# Patient Record
Sex: Female | Born: 1965 | Hispanic: No | Marital: Married | Smoking: Never smoker
Health system: Southern US, Community
[De-identification: ages and names within clinical notes are randomized; demographics above are authoritative.]

## PROBLEM LIST (undated history)

## (undated) DIAGNOSIS — K219 Gastro-esophageal reflux disease without esophagitis: Secondary | ICD-10-CM

## (undated) DIAGNOSIS — E079 Disorder of thyroid, unspecified: Secondary | ICD-10-CM

## (undated) HISTORY — DX: Morbid (severe) obesity due to excess calories: E66.01

---

## 2002-01-01 ENCOUNTER — Encounter: Payer: Self-pay | Admitting: Emergency Medicine

## 2002-01-01 ENCOUNTER — Emergency Department (HOSPITAL_COMMUNITY): Admission: EM | Admit: 2002-01-01 | Discharge: 2002-01-01 | Payer: Self-pay | Admitting: *Deleted

## 2002-01-06 ENCOUNTER — Encounter: Payer: Self-pay | Admitting: Surgery

## 2002-01-06 ENCOUNTER — Encounter: Admission: RE | Admit: 2002-01-06 | Discharge: 2002-01-06 | Payer: Self-pay | Admitting: Surgery

## 2002-02-05 ENCOUNTER — Encounter: Admission: RE | Admit: 2002-02-05 | Discharge: 2002-02-05 | Payer: Self-pay | Admitting: Radiology

## 2002-04-07 ENCOUNTER — Encounter: Payer: Self-pay | Admitting: Surgery

## 2002-04-07 ENCOUNTER — Encounter: Admission: RE | Admit: 2002-04-07 | Discharge: 2002-04-07 | Payer: Self-pay | Admitting: Radiology

## 2002-04-30 ENCOUNTER — Ambulatory Visit (HOSPITAL_BASED_OUTPATIENT_CLINIC_OR_DEPARTMENT_OTHER): Admission: RE | Admit: 2002-04-30 | Discharge: 2002-04-30 | Payer: Self-pay | Admitting: Surgery

## 2012-04-21 DIAGNOSIS — H5460 Unqualified visual loss, one eye, unspecified: Secondary | ICD-10-CM | POA: Insufficient documentation

## 2012-04-24 DIAGNOSIS — R7611 Nonspecific reaction to tuberculin skin test without active tuberculosis: Secondary | ICD-10-CM | POA: Insufficient documentation

## 2012-07-10 DIAGNOSIS — H4710 Unspecified papilledema: Secondary | ICD-10-CM | POA: Insufficient documentation

## 2013-02-21 DIAGNOSIS — E7800 Pure hypercholesterolemia, unspecified: Secondary | ICD-10-CM | POA: Insufficient documentation

## 2013-07-06 DIAGNOSIS — Q799 Congenital malformation of musculoskeletal system, unspecified: Secondary | ICD-10-CM | POA: Insufficient documentation

## 2013-07-06 DIAGNOSIS — H524 Presbyopia: Secondary | ICD-10-CM | POA: Insufficient documentation

## 2014-04-12 DIAGNOSIS — J309 Allergic rhinitis, unspecified: Secondary | ICD-10-CM | POA: Insufficient documentation

## 2014-04-12 DIAGNOSIS — E538 Deficiency of other specified B group vitamins: Secondary | ICD-10-CM | POA: Insufficient documentation

## 2015-01-21 DIAGNOSIS — M19031 Primary osteoarthritis, right wrist: Secondary | ICD-10-CM | POA: Insufficient documentation

## 2015-03-14 ENCOUNTER — Encounter (HOSPITAL_BASED_OUTPATIENT_CLINIC_OR_DEPARTMENT_OTHER): Payer: Self-pay | Admitting: *Deleted

## 2015-03-14 ENCOUNTER — Emergency Department (HOSPITAL_BASED_OUTPATIENT_CLINIC_OR_DEPARTMENT_OTHER)
Admission: EM | Admit: 2015-03-14 | Discharge: 2015-03-14 | Disposition: A | Discharge status: Home / Self Care | Payer: Self-pay | Attending: Emergency Medicine | Admitting: Emergency Medicine

## 2015-03-14 ENCOUNTER — Emergency Department (HOSPITAL_BASED_OUTPATIENT_CLINIC_OR_DEPARTMENT_OTHER): Payer: Self-pay

## 2015-03-14 DIAGNOSIS — K219 Gastro-esophageal reflux disease without esophagitis: Secondary | ICD-10-CM | POA: Insufficient documentation

## 2015-03-14 DIAGNOSIS — Z79899 Other long term (current) drug therapy: Secondary | ICD-10-CM | POA: Insufficient documentation

## 2015-03-14 DIAGNOSIS — R10816 Epigastric abdominal tenderness: Secondary | ICD-10-CM

## 2015-03-14 DIAGNOSIS — R630 Anorexia: Secondary | ICD-10-CM | POA: Insufficient documentation

## 2015-03-14 DIAGNOSIS — Z3202 Encounter for pregnancy test, result negative: Secondary | ICD-10-CM | POA: Insufficient documentation

## 2015-03-14 DIAGNOSIS — Z7951 Long term (current) use of inhaled steroids: Secondary | ICD-10-CM | POA: Insufficient documentation

## 2015-03-14 LAB — CBC WITH DIFFERENTIAL/PLATELET
Basophils Absolute: 0 10*3/uL (ref 0.0–0.1)
Basophils Relative: 0 % (ref 0–1)
Eosinophils Absolute: 0.1 10*3/uL (ref 0.0–0.7)
Eosinophils Relative: 2 % (ref 0–5)
HCT: 35.5 % — ABNORMAL LOW (ref 36.0–46.0)
HEMOGLOBIN: 11.6 g/dL — AB (ref 12.0–15.0)
LYMPHS ABS: 1.7 10*3/uL (ref 0.7–4.0)
Lymphocytes Relative: 22 % (ref 12–46)
MCH: 28.5 pg (ref 26.0–34.0)
MCHC: 32.7 g/dL (ref 30.0–36.0)
MCV: 87.2 fL (ref 78.0–100.0)
MONO ABS: 0.6 10*3/uL (ref 0.1–1.0)
MONOS PCT: 8 % (ref 3–12)
NEUTROS ABS: 5 10*3/uL (ref 1.7–7.7)
Neutrophils Relative %: 68 % (ref 43–77)
Platelets: 307 10*3/uL (ref 150–400)
RBC: 4.07 MIL/uL (ref 3.87–5.11)
RDW: 12.9 % (ref 11.5–15.5)
WBC: 7.4 10*3/uL (ref 4.0–10.5)

## 2015-03-14 LAB — URINE MICROSCOPIC-ADD ON

## 2015-03-14 LAB — COMPREHENSIVE METABOLIC PANEL
ALBUMIN: 3.6 g/dL (ref 3.5–5.0)
ALT: 26 U/L (ref 14–54)
AST: 23 U/L (ref 15–41)
Alkaline Phosphatase: 72 U/L (ref 38–126)
Anion gap: 8 (ref 5–15)
BUN: 12 mg/dL (ref 6–20)
CALCIUM: 9.1 mg/dL (ref 8.9–10.3)
CHLORIDE: 102 mmol/L (ref 101–111)
CO2: 28 mmol/L (ref 22–32)
Creatinine, Ser: 0.58 mg/dL (ref 0.44–1.00)
GFR calc Af Amer: >60 mL/min (ref >60)
GFR calc non Af Amer: >60 mL/min (ref >60)
Glucose, Bld: 108 mg/dL — ABNORMAL HIGH (ref 65–99)
Potassium: 3.7 mmol/L (ref 3.5–5.1)
SODIUM: 138 mmol/L (ref 135–145)
Total Bilirubin: 0.6 mg/dL (ref 0.3–1.2)
Total Protein: 7.5 g/dL (ref 6.5–8.1)

## 2015-03-14 LAB — URINALYSIS, ROUTINE W REFLEX MICROSCOPIC
BILIRUBIN URINE: NEGATIVE
Glucose, UA: NEGATIVE mg/dL
KETONES UR: NEGATIVE mg/dL
Leukocytes, UA: NEGATIVE
Nitrite: NEGATIVE
Protein, ur: NEGATIVE mg/dL
SPECIFIC GRAVITY, URINE: 1.008 (ref 1.005–1.030)
UROBILINOGEN UA: 0.2 mg/dL (ref 0.0–1.0)
pH: 5.5 (ref 5.0–8.0)

## 2015-03-14 LAB — LIPASE, BLOOD: LIPASE: 26 U/L (ref 22–51)

## 2015-03-14 LAB — PREGNANCY, URINE: PREG TEST UR: NEGATIVE

## 2015-03-14 MED ORDER — OMEPRAZOLE 20 MG PO CPDR: 20.0000 mg | DELAYED_RELEASE_CAPSULE | Freq: Every day | ORAL | Status: DC

## 2015-03-14 MED ORDER — GI COCKTAIL ~~LOC~~
30.0000 mL | Freq: Once | ORAL | Status: AC
  Administered 2015-03-14: 30 mL via ORAL
  Filled 2015-03-14: qty 30

## 2015-03-14 MED ORDER — ONDANSETRON HCL 4 MG/2ML IJ SOLN
4.0000 mg | Freq: Once | INTRAMUSCULAR | Status: AC
  Administered 2015-03-14: 4 mg via INTRAVENOUS
  Filled 2015-03-14: qty 2

## 2015-03-14 MED ORDER — PANTOPRAZOLE SODIUM 40 MG IV SOLR
40.0000 mg | Freq: Once | INTRAVENOUS | Status: AC
  Administered 2015-03-14: 40 mg via INTRAVENOUS
  Filled 2015-03-14: qty 40

## 2015-03-14 MED ORDER — MORPHINE SULFATE 4 MG/ML IJ SOLN
4.0000 mg | Freq: Once | INTRAMUSCULAR | Status: AC
  Administered 2015-03-14: 4 mg via INTRAVENOUS
  Filled 2015-03-14: qty 1

## 2015-03-14 NOTE — ED Provider Notes (Signed)
Patient seen/examined in the Emergency Department in conjunction with Midlevel Provider  Patient reports epigastric pain Exam : awake/alert, moderate epigastric tenderness Plan: advised [place] testing    Zadie Rhine, MD 03/14/15 [phone]

## 2015-03-14 NOTE — ED Provider Notes (Signed)
CSN: [id number]     Arrival date & time 03/14/15  1332 History   First MD Initiated Contact with Patient 03/14/15 1420     Chief Complaint  Patient presents with  . Abdominal Pain   Jessica Fleming is a 49 y.o. female who is otherwise healthy presents to the ED complaining of epigastric abdominal pain for the past week. She reports she has had a gastric ulcer when she was in high school and this pain feels similar. She complains of 9 out of 10 epigastric abdominal pain that is worse immediately after eating. She complains of a burning sensation after she has eaten. She reports sometimes her pain will move up into her chest, but she denies current chest pain. She is taking nothing for treatment today. Her last menstrual cycle was 03/07/2015 and she is currently on her cycle. She denies lower abdominal pain. The patient denies fevers, chills, vomiting, nausea, hematemesis, diarrhea, constipation, hematochezia, urinary symptoms, lower abdominal pain, chest pain, shortness of breath, palpitations, cough, lightheadedness, dizziness, rashes or weakness.  (Consider location/radiation/quality/duration/timing/severity/associated sxs/prior Treatment) HPI  History reviewed. No pertinent past medical history. History reviewed. No pertinent past surgical history. History reviewed. No pertinent family history. History  Substance Use Topics  . Smoking status: Never Smoker   . Smokeless tobacco: Not on file  . Alcohol Use: No   OB History    No data available     Review of Systems  Constitutional: Positive for appetite change. Negative for fever and chills.  HENT: Negative for congestion and sore throat.   Eyes: Negative for visual disturbance.  Respiratory: Negative for cough, shortness of breath and wheezing.   Cardiovascular: Negative for chest pain and palpitations.  Gastrointestinal: Positive for abdominal pain. Negative for nausea, vomiting, diarrhea and blood in stool.  Genitourinary: Negative  for dysuria, urgency, frequency, hematuria and difficulty urinating.  Musculoskeletal: Negative for back pain and neck pain.  Skin: Negative for rash.  Neurological: Negative for dizziness, weakness, light-headedness and headaches.      Allergies  Review of patient's allergies indicates no known allergies.  Home Medications   Prior to Admission medications   Medication Sig Start Date End Date Taking? Authorizing Provider  cetirizine (ZYRTEC) 10 MG tablet Take 10 mg by mouth daily.   Yes Historical Provider, MD  fluticasone (FLONASE) 50 MCG/ACT nasal spray Place 2 sprays into both nostrils daily.   Yes Historical Provider, MD  omeprazole (PRILOSEC) 20 MG capsule Take 1 capsule (20 mg total) by mouth daily. 03/14/15   Everlene Farrier, PA-C   BP 109/53 mmHg  Pulse 58  Temp(Src) 98.3 F (36.8 C) (Oral)  Resp 16  Ht 5\' 2"  (1.575 m)  Wt 190 lb (86.183 kg)  BMI 34.74 kg/m2  SpO2 98%  LMP 03/14/2015 (LMP Unknown) Physical Exam  Constitutional: She is oriented to person, place, and time. She appears well-developed and well-nourished. No distress.  Nontoxic appearing.  HENT:  Head: Normocephalic and atraumatic.  Mouth/Throat: Oropharynx is clear and moist. No oropharyngeal exudate.  Eyes: Conjunctivae are normal. Pupils are equal, round, and reactive to light. Right eye exhibits no discharge. Left eye exhibits no discharge.  Neck: Neck supple. No JVD present.  Cardiovascular: Normal rate, regular rhythm, normal heart sounds and intact distal pulses.  Exam reveals no gallop and no friction rub.   No murmur heard. Bilateral radial pulses are intact.  Pulmonary/Chest: Effort normal and breath sounds normal. No respiratory distress. She has no wheezes. She has no rales.  Lungs  are clear to auscultation bilaterally.  Abdominal: Soft. Bowel sounds are normal. She exhibits no distension and no mass. There is tenderness. There is no rebound and no guarding.  Abdomen is soft. Bowel sounds are  present. Patient has epigastric tenderness to palpation. No right upper quadrant tenderness. Negative Murphy sign. No McBurney's point tenderness. Negative psoas and obturator sign.  Musculoskeletal: She exhibits no edema or tenderness.  No lower extremity edema or tenderness.  Lymphadenopathy:    She has no cervical adenopathy.  Neurological: She is alert and oriented to person, place, and time. Coordination normal.  Skin: Skin is warm and dry. No rash noted. She is not diaphoretic. No erythema. No pallor.  Psychiatric: She has a normal mood and affect. Her behavior is normal.  Nursing note and vitals reviewed.   ED Course  Procedures (including critical care time) Labs Review Labs Reviewed  COMPREHENSIVE METABOLIC PANEL - Abnormal; Notable for the following:    Glucose, Bld 108 (*)    All other components within normal limits  CBC WITH DIFFERENTIAL/PLATELET - Abnormal; Notable for the following:    Hemoglobin 11.6 (*)    HCT 35.5 (*)    All other components within normal limits  PREGNANCY, URINE  LIPASE, BLOOD  URINALYSIS, ROUTINE W REFLEX MICROSCOPIC (NOT AT [facility])    Imaging Review US Abdomen Complete  03/14/2015   CLINICAL DATA:  Worsening epigastric pain over the past week. Left upper quadrant and epigastric tenderness. Initial encounter.  EXAM: ULTRASOUND ABDOMEN COMPLETE  COMPARISON:  None.  FINDINGS: Gallbladder: No gallstones or wall thickening visualized. No sonographic Murphy sign noted.  Common bile duct: Diameter: 0.3 cm.  Liver: No focal lesion identified. Increased in parenchymal echogenicity.  IVC: No abnormality visualized.  Pancreas: Visualized portion unremarkable.  Spleen: Size and appearance within normal limits.  Right Kidney: Length: 10.0 cm. Echogenicity within normal limits. No mass or hydronephrosis visualized.  Left Kidney: Length: 10.5 cm. Echogenicity within normal limits. No mass or hydronephrosis visualized.  Abdominal aorta: No aneurysm visualized.  Other  findings: None.  IMPRESSION: No acute finding.  Negative for gallstones.  Fatty infiltration of the liver.   Electronically Signed   By: Drusilla Kanner M.D.   On: 03/14/2015 16:41     EKG Interpretation   Date/Time:  Monday March 14 2015 15:19:35 EDT Ventricular Rate:  71 PR Interval:  154 QRS Duration: 84 QT Interval:  412 QTC Calculation: 447 R Axis:   20 Text Interpretation:  Normal sinus rhythm Low voltage QRS Borderline ECG  No significant change since last tracing Confirmed by Bebe Shaggy  MD, DONALD  [phone]) on 03/14/2015 3:21:13 PM      Filed Vitals:   03/14/15 1344 03/14/15 1623  BP: 126/73 109/53  Pulse: 69 58  Temp: 98.6 F (37 C) 98.3 F (36.8 C)  TempSrc:  Oral  Resp: 16 16  Height: 5\' 2"  (1.575 m)   Weight: 190 lb (86.183 kg)   SpO2: 99% 98%     MDM   Meds given in ED:  Medications  gi cocktail (Maalox,Lidocaine,Donnatal) (30 mLs Oral Given 03/14/15 1503)  pantoprazole (PROTONIX) injection 40 mg (40 mg Intravenous Given 03/14/15 1556)  ondansetron (ZOFRAN) injection 4 mg (4 mg Intravenous Given 03/14/15 1555)  morphine 4 MG/ML injection 4 mg (4 mg Intravenous Given 03/14/15 1555)    New Prescriptions   OMEPRAZOLE (PRILOSEC) 20 MG CAPSULE    Take 1 capsule (20 mg total) by mouth daily.    Final diagnoses:  Epigastric  abdominal tenderness  Gastroesophageal reflux disease, esophagitis presence not specified   This is a 49 y.o. female who is otherwise healthy presents to the ED complaining of epigastric abdominal pain for the past week. She reports she has had a gastric ulcer when she was in high school and this pain feels similar. She complains of 9 out of 10 epigastric abdominal pain that is worse immediately after eating. She complains of a burning sensation after she has eaten. She denies any chest pain or shortness of breath. On exam patient is afebrile and nontoxic appearing. Her abdomen is soft and she has epigastric tenderness to palpation. No right upper  quadrant tenderness. No peritoneal signs. CMP is unremarkable. Lipase is normal at 26. CBC is remarkable only for hemoglobin 11.6 and hematocrit of 35.5. CBC is otherwise unremarkable. Abdominal ultrasound was obtained which showed no acute finding. No gallstones. EKG for epigastric pain was obtained that showed a normal sinus rhythm and no changes from her last tracing.  Urine pregnancy test is negative. Patient was provided with GI cocktail, IV Protonix, Zofran and morphine. At reevaluation the patient reports her pain has completely resolved. She denies any nausea or vomiting. We'll discharge the patient prescription for omeprazole. Education on food choices to help with symptoms. Advised patient she should follow-up with a gastroenterologist for continued pain. Also advised the patient to follow-up with her primary care provider. I advised the patient to follow-up with their primary care provider this week. I advised the patient to return to the emergency department with new or worsening symptoms or new concerns. The patient verbalized understanding and agreement with plan.    This patient was discussed with and evaluated by Dr. Bebe Shaggy who agrees with assessment and plan.   Everlene Farrier, PA-C 03/14/15 1720  Zadie Rhine, MD 03/14/15 [phone]

## 2015-03-14 NOTE — ED Notes (Signed)
Patient transported to Ultrasound and returned 

## 2015-03-14 NOTE — Discharge Instructions (Signed)
Food Choices for Gastroesophageal Reflux Disease When you have gastroesophageal reflux disease (GERD), the foods you eat and your eating habits are very important. Choosing the right foods can help ease the discomfort of GERD. WHAT GENERAL GUIDELINES DO I NEED TO FOLLOW?  Choose fruits, vegetables, whole grains, low-fat dairy products, and low-fat meat, fish, and poultry.  Limit fats such as oils, salad dressings, butter, nuts, and avocado.  Keep a food diary to identify foods that cause symptoms.  Avoid foods that cause reflux. These may be different for different people.  Eat frequent small meals instead of three large meals each day.  Eat your meals slowly, in a relaxed setting.  Limit fried foods.  Cook foods using methods other than frying.  Avoid drinking alcohol.  Avoid drinking large amounts of liquids with your meals.  Avoid bending over or lying down until 2-3 hours after eating. WHAT FOODS ARE NOT RECOMMENDED? The following are some foods and drinks that may worsen your symptoms: Vegetables Tomatoes. Tomato juice. Tomato and spaghetti sauce. Chili peppers. Onion and garlic. Horseradish. Fruits Oranges, grapefruit, and lemon (fruit and juice). Meats High-fat meats, fish, and poultry. This includes hot dogs, ribs, ham, sausage, salami, and bacon. Dairy Whole milk and chocolate milk. Sour cream. Cream. Butter. Ice cream. Cream cheese.  Beverages Coffee and tea, with or without caffeine. Carbonated beverages or energy drinks. Condiments Hot sauce. Barbecue sauce.  Sweets/Desserts Chocolate and cocoa. Donuts. Peppermint and spearmint. Fats and Oils High-fat foods, including [place] fries and potato chips. Other Vinegar. Strong spices, such as black pepper, white pepper, red pepper, cayenne, curry powder, cloves, ginger, and chili powder. The items listed above may not be a complete list of foods and beverages to avoid. Contact your dietitian for more  information. Document Released: 09/03/2005 Document Revised: 09/08/2013 Document Reviewed: 07/08/2013 [facility] Patient Information 2015 [place]. This information is not intended to replace advice given to you by your health care provider. Make sure you discuss any questions you have with your health care provider. Peptic Ulcer A peptic ulcer is a sore in the lining of your esophagus (esophageal ulcer), stomach (gastric ulcer), or in the first part of your small intestine (duodenal ulcer). The ulcer causes erosion into the deeper tissue. CAUSES  Normally, the lining of the stomach and the small intestine protects itself from the acid that digests food. The protective lining can be damaged by:  An infection caused by a bacterium called Helicobacter pylori (H. pylori).  Regular use of nonsteroidal anti-inflammatory drugs (NSAIDs), such as ibuprofen or aspirin.  Smoking tobacco. Other risk factors include being older than 50, drinking alcohol excessively, and having a family history of ulcer disease.  SYMPTOMS   Burning pain or gnawing in the area between the chest and the belly button.  Heartburn.  Nausea and vomiting.  Bloating. The pain can be worse on an empty stomach and at night. If the ulcer results in bleeding, it can cause:  Black, tarry stools.  Vomiting of bright red blood.  Vomiting of coffee-ground-looking materials. DIAGNOSIS  A diagnosis is usually made based upon your history and an exam. Other tests and procedures may be performed to find the cause of the ulcer. Finding a cause will help determine the best treatment. Tests and procedures may include:  Blood tests, stool tests, or breath tests to check for the bacterium H. pylori.  An upper gastrointestinal (GI) series of the esophagus, stomach, and small intestine.  An endoscopy to examine the esophagus, stomach,  and small intestine.  A biopsy. TREATMENT  Treatment may include:  Eliminating the cause of  the ulcer, such as smoking, NSAIDs, or alcohol.  Medicines to reduce the amount of acid in your digestive tract.  Antibiotic medicines if the ulcer is caused by the H. pylori bacterium.  An upper endoscopy to treat a bleeding ulcer.  Surgery if the bleeding is severe or if the ulcer created a hole somewhere in the digestive system. HOME CARE INSTRUCTIONS   Avoid tobacco, alcohol, and caffeine. Smoking can increase the acid in the stomach, and continued smoking will impair the healing of ulcers.  Avoid foods and drinks that seem to cause discomfort or aggravate your ulcer.  Only take medicines as directed by your caregiver. Do not substitute over-the-counter medicines for prescription medicines without talking to your caregiver.  Keep any follow-up appointments and tests as directed. SEEK MEDICAL CARE IF:   Your do not improve within 7 days of starting treatment.  You have ongoing indigestion or heartburn. SEEK IMMEDIATE MEDICAL CARE IF:   You have sudden, sharp, or persistent abdominal pain.  You have bloody or dark black, tarry stools.  You vomit blood or vomit that looks like coffee grounds.  You become light-headed, weak, or feel faint.  You become sweaty or clammy. MAKE SURE YOU:   Understand these instructions.  Will watch your condition.  Will get help right away if you are not doing well or get worse. Document Released: 08/31/2000 Document Revised: 01/18/2014 Document Reviewed: 04/02/2012 [facility] Patient Information 2015 [place], [place]. This information is not intended to replace advice given to you by your health care provider. Make sure you discuss any questions you have with your health care provider.  Abdominal Pain Many things can cause abdominal pain. Usually, abdominal pain is not caused by a disease and will improve without treatment. It can often be observed and treated at home. Your health care provider will do a physical exam and possibly order blood  tests and X-rays to help determine the seriousness of your pain. However, in many cases, more time must pass before a clear cause of the pain can be found. Before that point, your health care provider may not know if you need more testing or further treatment. HOME CARE INSTRUCTIONS  Monitor your abdominal pain for any changes. The following actions may help to alleviate any discomfort you are experiencing:  Only take over-the-counter or prescription medicines as directed by your health care provider.  Do not take laxatives unless directed to do so by your health care provider.  Try a clear liquid diet (broth, tea, or water) as directed by your health care provider. Slowly move to a bland diet as tolerated. SEEK MEDICAL CARE IF:  You have unexplained abdominal pain.  You have abdominal pain associated with nausea or diarrhea.  You have pain when you urinate or have a bowel movement.  You experience abdominal pain that wakes you in the night.  You have abdominal pain that is worsened or improved by eating food.  You have abdominal pain that is worsened with eating fatty foods.  You have a fever. SEEK IMMEDIATE MEDICAL CARE IF:   Your pain does not go away within 2 hours.  You keep throwing up (vomiting).  Your pain is felt only in portions of the abdomen, such as the right side or the left lower portion of the abdomen.  You pass bloody or black tarry stools. MAKE SURE YOU:  Understand these instructions.  Will watch your condition.   Will get help right away if you are not doing well or get worse.  Document Released: 06/13/2005 Document Revised: 09/08/2013 Document Reviewed: 05/13/2013 [facility] Patient Information 2015 [place], [place]. This information is not intended to replace advice given to you by your health care provider. Make sure you discuss any questions you have with your health care provider.

## 2015-03-14 NOTE — ED Notes (Signed)
Pt c/o abd pain x 1 week , seen at [place] ED x 1 week ago for same

## 2015-03-14 NOTE — ED Notes (Signed)
Blood drawn with IV start and taken to lab

## 2015-03-14 NOTE — ED Notes (Addendum)
Nurse first-pt unable to give urine sample at this time-[facility] and left

## 2015-06-11 ENCOUNTER — Emergency Department (HOSPITAL_BASED_OUTPATIENT_CLINIC_OR_DEPARTMENT_OTHER): Payer: Self-pay

## 2015-06-11 ENCOUNTER — Emergency Department (HOSPITAL_BASED_OUTPATIENT_CLINIC_OR_DEPARTMENT_OTHER)
Admission: EM | Admit: 2015-06-11 | Discharge: 2015-06-11 | Disposition: A | Discharge status: Home / Self Care | Payer: Self-pay | Attending: Physician Assistant | Admitting: Physician Assistant

## 2015-06-11 ENCOUNTER — Encounter (HOSPITAL_BASED_OUTPATIENT_CLINIC_OR_DEPARTMENT_OTHER): Payer: Self-pay | Admitting: Emergency Medicine

## 2015-06-11 DIAGNOSIS — R0602 Shortness of breath: Secondary | ICD-10-CM | POA: Insufficient documentation

## 2015-06-11 DIAGNOSIS — R42 Dizziness and giddiness: Secondary | ICD-10-CM | POA: Insufficient documentation

## 2015-06-11 DIAGNOSIS — R079 Chest pain, unspecified: Secondary | ICD-10-CM

## 2015-06-11 DIAGNOSIS — M549 Dorsalgia, unspecified: Secondary | ICD-10-CM | POA: Insufficient documentation

## 2015-06-11 DIAGNOSIS — K219 Gastro-esophageal reflux disease without esophagitis: Secondary | ICD-10-CM | POA: Insufficient documentation

## 2015-06-11 DIAGNOSIS — Z7951 Long term (current) use of inhaled steroids: Secondary | ICD-10-CM | POA: Insufficient documentation

## 2015-06-11 DIAGNOSIS — Z79899 Other long term (current) drug therapy: Secondary | ICD-10-CM | POA: Insufficient documentation

## 2015-06-11 HISTORY — DX: Gastro-esophageal reflux disease without esophagitis: K21.9

## 2015-06-11 LAB — CBC
HCT: 33 % — ABNORMAL LOW (ref 36.0–46.0)
Hemoglobin: 10.8 g/dL — ABNORMAL LOW (ref 12.0–15.0)
MCH: 28.1 pg (ref 26.0–34.0)
MCHC: 32.7 g/dL (ref 30.0–36.0)
MCV: 85.9 fL (ref 78.0–100.0)
PLATELETS: 274 10*3/uL (ref 150–400)
RBC: 3.84 MIL/uL — AB (ref 3.87–5.11)
RDW: 12.8 % (ref 11.5–15.5)
WBC: 7.1 10*3/uL (ref 4.0–10.5)

## 2015-06-11 LAB — BASIC METABOLIC PANEL
Anion gap: 6 (ref 5–15)
BUN: 14 mg/dL (ref 6–20)
CALCIUM: 8.9 mg/dL (ref 8.9–10.3)
CHLORIDE: 106 mmol/L (ref 101–111)
CO2: 28 mmol/L (ref 22–32)
Creatinine, Ser: 0.56 mg/dL (ref 0.44–1.00)
GFR calc non Af Amer: >60 mL/min (ref >60)
Glucose, Bld: 95 mg/dL (ref 65–99)
Potassium: 3.7 mmol/L (ref 3.5–5.1)
Sodium: 140 mmol/L (ref 135–145)

## 2015-06-11 LAB — TROPONIN I

## 2015-06-11 MED ORDER — TRAMADOL HCL 50 MG PO TABS: 50.0000 mg | ORAL_TABLET | Freq: Four times a day (QID) | ORAL | Status: DC | PRN

## 2015-06-11 MED ORDER — TRAMADOL HCL 50 MG PO TABS
100.0000 mg | ORAL_TABLET | Freq: Once | ORAL | Status: AC
  Administered 2015-06-11: 100 mg via ORAL
  Filled 2015-06-11: qty 2

## 2015-06-11 NOTE — ED Notes (Signed)
Dr. Corlis Leak and radiology at [facility], pt to xray. Alert, NAD, calm, interactive, resps e/u, no dyspnea noted.

## 2015-06-11 NOTE — Discharge Instructions (Signed)
Chest Pain (Nonspecific) °It is often hard to give a specific diagnosis for the cause of chest pain. There is always a chance that your pain could be related to something serious, such as a heart attack or a blood clot in the lungs. You need to follow up with your health care provider for further evaluation. °CAUSES  °· Heartburn. °· Pneumonia or bronchitis. °· Anxiety or stress. °· Inflammation around your heart (pericarditis) or lung (pleuritis or pleurisy). °· A blood clot in the lung. °· A collapsed lung (pneumothorax). It can develop suddenly on its own (spontaneous pneumothorax) or from trauma to the chest. °· Shingles infection (herpes zoster virus). °The chest wall is composed of bones, muscles, and cartilage. Any of these can be the source of the pain. °· The bones can be bruised by injury. °· The muscles or cartilage can be strained by coughing or overwork. °· The cartilage can be affected by inflammation and become sore (costochondritis). °DIAGNOSIS  °Lab tests or other studies may be needed to find the cause of your pain. Your health care provider may have you take a test called an ambulatory electrocardiogram (ECG). An ECG records your heartbeat patterns over a 24-hour period. You may also have other tests, such as: °· Transthoracic echocardiogram (TTE). During echocardiography, sound waves are used to evaluate how blood flows through your heart. °· Transesophageal echocardiogram (TEE). °· Cardiac monitoring. This allows your health care provider to monitor your heart rate and rhythm in real time. °· Holter monitor. This is a portable device that records your heartbeat and can help diagnose heart arrhythmias. It allows your health care provider to track your heart activity for several days, if needed. °· Stress tests by exercise or by giving medicine that makes the heart beat faster. °TREATMENT  °· Treatment depends on what may be causing your chest pain. Treatment may include: °· Acid blockers for  heartburn. °· Anti-inflammatory medicine. °· Pain medicine for inflammatory conditions. °· Antibiotics if an infection is present. °· You may be advised to change lifestyle habits. This includes stopping smoking and avoiding alcohol, caffeine, and chocolate. °· You may be advised to keep your head raised (elevated) when sleeping. This reduces the chance of acid going backward from your stomach into your esophagus. °Most of the time, nonspecific chest pain will improve within 2-3 days with rest and mild pain medicine.  °HOME CARE INSTRUCTIONS  °· If antibiotics were prescribed, take them as directed. Finish them even if you start to feel better. °· For the next few days, avoid physical activities that bring on chest pain. Continue physical activities as directed. °· Do not use any tobacco products, including cigarettes, chewing tobacco, or electronic cigarettes. °· Avoid drinking alcohol. °· Only take medicine as directed by your health care provider. °· Follow your health care provider's suggestions for further testing if your chest pain does not go away. °· Keep any follow-up appointments you made. If you do not go to an appointment, you could develop lasting (chronic) problems with pain. If there is any problem keeping an appointment, call to reschedule. °SEEK MEDICAL CARE IF:  °· Your chest pain does not go away, even after treatment. °· You have a rash with blisters on your chest. °· You have a fever. °SEEK IMMEDIATE MEDICAL CARE IF:  °· You have increased chest pain or pain that spreads to your arm, neck, jaw, back, or abdomen. °· You have shortness of breath. °· You have an increasing cough, or you cough   up blood. °· You have severe back or abdominal pain. °· You feel nauseous or vomit. °· You have severe weakness. °· You faint. °· You have chills. °This is an emergency. Do not wait to see if the pain will go away. Get medical help at once. Call your local emergency services (911 in [place] Do not drive  yourself to the hospital. °MAKE SURE YOU:  °· Understand these instructions. °· Will watch your condition. °· Will get help right away if you are not doing well or get worse. °Document Released: 06/13/2005 Document Revised: 09/08/2013 Document Reviewed: 04/08/2008 °ExitCare® Patient Information ©2015 [employer], [employer]. This information is not intended to replace advice given to you by your health care provider. Make sure you discuss any questions you have with your health care provider. ° °Chest Wall Pain °Chest wall pain is pain in or around the bones and muscles of your chest. It may take up to 6 weeks to get better. It may take longer if you must stay physically active in your work and activities.  °CAUSES  °Chest wall pain may happen on its own. However, it may be caused by: °· A viral illness like the flu. °· Injury. °· Coughing. °· Exercise. °· Arthritis. °· Fibromyalgia. °· Shingles. °HOME CARE INSTRUCTIONS  °· Avoid overtiring physical activity. Try not to strain or perform activities that cause pain. This includes any activities using your chest or your abdominal and side muscles, especially if heavy weights are used. °· Put ice on the sore area. °¨ Put ice in a plastic bag. °¨ Place a towel between your skin and the bag. °¨ Leave the ice on for 15-20 minutes per hour while awake for the first 2 days. °· Only take over-the-counter or prescription medicines for pain, discomfort, or fever as directed by your caregiver. °SEEK IMMEDIATE MEDICAL CARE IF:  °· Your pain increases, or you are very uncomfortable. °· You have a fever. °· Your chest pain becomes worse. °· You have new, unexplained symptoms. °· You have nausea or vomiting. °· You feel sweaty or lightheaded. °· You have a cough with phlegm (sputum), or you cough up blood. °MAKE SURE YOU:  °· Understand these instructions. °· Will watch your condition. °· Will get help right away if you are not doing well or get worse. °Document Released: 09/03/2005 Document  Revised: 11/26/2011 Document Reviewed: 04/30/2011 °ExitCare® Patient Information ©2015 [employer], [employer]. This information is not intended to replace advice given to you by your health care provider. Make sure you discuss any questions you have with your health care provider. ° °

## 2015-06-11 NOTE — ED Notes (Signed)
Patient states that she is having chest pain x 4 days, also reports that she is having SOB. Patient is very anxious and reports multiple other complaints over the last few days that she is not experiencing now. Here concern today is the she is having a hard time catching her breath.

## 2015-06-11 NOTE — ED Notes (Signed)
Pt alert, NAD, calm, interactive, res[ps e/u, speaking in clear complete sentences, no dyspnea noted, c/o R breast pain, also some sob and light headedness (denies: dizziness, nvd, fever, cough, congestion, cold sx). Daughter at [facility], pending xray and lab results, pain med given.

## 2015-06-11 NOTE — ED Provider Notes (Signed)
CSN: [id number]     Arrival date & time 06/11/15  1948 History  This chart was scribed for Jessica Randall An, MD by Lyndel Safe, ED Scribe. This patient was seen in room MH09/MH09 and the patient's care was started 8:14 PM.   Chief Complaint  Patient presents with  . Chest Pain   The history is provided by the patient. No language interpreter was used.   HPI Comments: Kaysey Berndt is a 49 y.o. female, with a PMhx of GERD, who presents to the Emergency Department complaining of gradually worsening, waxing and waning centralized chest pain that radiates to right side of chest X 5 days. Her pain is exacerbated and reporudcible with movement of right arm. Pt has a similar pain in her right, upper back. She is right-handed. She reports associated waxing and waning dizziness and SOB. The pt reports a history of similar pain when she was diagnosed with tendinitis in her right arm that she believes is due to her job where she performs repetitive motions with her right arm. She has been taking Advil with no relief of pain. She notes tramadol that was prescribed for her tendinitis provided relief of her similar pain in the past. No recent travel. She denies PMhx of HLD or HTN. She was followed by a PCP but has recently been experiencing issues with her medicaid and has not been able to follow with PCP in the last 5 months.   Past Medical History  Diagnosis Date  . GERD (gastroesophageal reflux disease)    History reviewed. No pertinent past surgical history. History reviewed. No pertinent family history. Social History  Substance Use Topics  . Smoking status: Never Smoker   . Smokeless tobacco: None  . Alcohol Use: No   OB History    No data available     Review of Systems  Respiratory: Positive for shortness of breath.   Cardiovascular: Positive for chest pain ( chest wall ).  Musculoskeletal: Positive for back pain.  Neurological: Positive for dizziness.  All other systems reviewed  and are negative.  Allergies  Review of patient's allergies indicates no known allergies.  Home Medications   Prior to Admission medications   Medication Sig Start Date End Date Taking? Authorizing Provider  cetirizine (ZYRTEC) 10 MG tablet Take 10 mg by mouth daily.    Historical Provider, MD  fluticasone (FLONASE) 50 MCG/ACT nasal spray Place 2 sprays into both nostrils daily.    Historical Provider, MD  omeprazole (PRILOSEC) 20 MG capsule Take 1 capsule (20 mg total) by mouth daily. 03/14/15   Everlene Farrier, PA-C   BP 119/56 mmHg  Pulse 73  Temp(Src) 98.1 F (36.7 C) (Oral)  Resp 18  Ht  (1.575 m)  Wt 190 lb (86.183 kg)  BMI 34.74 kg/m2  SpO2 99%  LMP 06/11/2015 Physical Exam  Constitutional: She is oriented to person, place, and time. She appears well-developed and well-nourished. No distress.  NAD.  HENT:  Head: Normocephalic.  Mouth/Throat: Oropharynx is clear and moist. No oropharyngeal exudate.  Eyes: Conjunctivae are normal.  Neck: Neck supple.  Cardiovascular: Normal rate, regular rhythm and normal heart sounds.   Pulmonary/Chest: Effort normal and breath sounds normal. No respiratory distress. She exhibits tenderness.  Chest wall tenderness that is reproducible with movement; lungs clear to auscultation bilaterally.   Abdominal: Soft. There is no tenderness.  Neurological: She is alert and oriented to person, place, and time. No cranial nerve deficit.  Alert and oriented X 3; MAE  X 4.     ED Course  Procedures  DIAGNOSTIC STUDIES: Oxygen Saturation is 99% on RA, normal by my interpretation.    COORDINATION OF CARE: 8:23 PM Discussed treatment plan with pt at bedside and pt agreed to plan.   Labs Review Labs Reviewed  BASIC METABOLIC PANEL  CBC  TROPONIN I    Imaging Review Dg Chest 2 View  06/11/2015   CLINICAL DATA:  Patient with worsening, waxing and waning centralized chest pain radiating to the right side of the chest for 5 days.  EXAM:  CHEST  2 VIEW  COMPARISON:  Chest radiograph 03/08/2015  FINDINGS: The heart size and mediastinal contours are within normal limits. Both lungs are clear. The visualized skeletal structures are unremarkable.  IMPRESSION: No active cardiopulmonary disease.   Electronically Signed   By: Annia Belt M.D.   On: 06/11/2015 20:36   I have personally reviewed and evaluated these images and lab results as part of my medical decision-making.   EKG Interpretation None        EKG Interpretation  Date/Time:    Ventricular Rate:    PR Interval:    QRS Duration:   QT Interval:    QTC Calculation:   R Axis:     Text Interpretation:        EKG already approved in MUSE.  No acute ischemia.   MDM   Final diagnoses:  None    Patient is a very pleasant 49 year old female presenting with past history significant for GERD and tendinitis in her right arm. Patient works at [employer]. She notices that lifting things and moving things with right hand makes the pain worse. It appears to be muscular skeletal. She can perform the repetitive motion here in the emergency department it brings on the pain. We will make sure that there is no cardiac component to this with Fleming EKG and a single troponin.  We will treat tramadol and have her follow-up with her PCP when she gets her Medicaid sorted out. In the meantime she has access to a clinic that will take her without Medicaid.  I personally performed the services described in this documentation, which was scribed in my presence. The recorded information has been reviewed and is accurate.    Jessica Randall An, MD 06/11/15 2302

## 2015-07-08 ENCOUNTER — Emergency Department (HOSPITAL_BASED_OUTPATIENT_CLINIC_OR_DEPARTMENT_OTHER): Payer: Self-pay

## 2015-07-08 ENCOUNTER — Emergency Department (HOSPITAL_BASED_OUTPATIENT_CLINIC_OR_DEPARTMENT_OTHER)
Admission: EM | Admit: 2015-07-08 | Discharge: 2015-07-08 | Disposition: A | Discharge status: Home / Self Care | Payer: Self-pay | Attending: Emergency Medicine | Admitting: Emergency Medicine

## 2015-07-08 ENCOUNTER — Encounter (HOSPITAL_BASED_OUTPATIENT_CLINIC_OR_DEPARTMENT_OTHER): Payer: Self-pay | Admitting: *Deleted

## 2015-07-08 DIAGNOSIS — B349 Viral infection, unspecified: Secondary | ICD-10-CM | POA: Insufficient documentation

## 2015-07-08 DIAGNOSIS — Z7951 Long term (current) use of inhaled steroids: Secondary | ICD-10-CM | POA: Insufficient documentation

## 2015-07-08 DIAGNOSIS — K219 Gastro-esophageal reflux disease without esophagitis: Secondary | ICD-10-CM | POA: Insufficient documentation

## 2015-07-08 DIAGNOSIS — Z79899 Other long term (current) drug therapy: Secondary | ICD-10-CM | POA: Insufficient documentation

## 2015-07-08 DIAGNOSIS — H9203 Otalgia, bilateral: Secondary | ICD-10-CM | POA: Insufficient documentation

## 2015-07-08 MED ORDER — ACETAMINOPHEN 500 MG PO TABS: 500.0000 mg | ORAL_TABLET | Freq: Four times a day (QID) | ORAL | Status: DC | PRN

## 2015-07-08 MED ORDER — IBUPROFEN 800 MG PO TABS
800.0000 mg | ORAL_TABLET | Freq: Once | ORAL | Status: AC
  Administered 2015-07-08: 800 mg via ORAL
  Filled 2015-07-08: qty 1

## 2015-07-08 NOTE — ED Notes (Signed)
Cough congestion fever and sore throat x 2 days

## 2015-07-08 NOTE — Discharge Instructions (Signed)
Upper Respiratory Infection, Adult Most upper respiratory infections (URIs) are a viral infection of the air passages leading to the lungs. A URI affects the nose, throat, and upper air passages. The most common type of URI is nasopharyngitis and is typically referred to as "the common cold." URIs run their course and usually go away on their own. Most of the time, a URI does not require medical attention, but sometimes a bacterial infection in the upper airways can follow a viral infection. This is called a secondary infection. Sinus and middle ear infections are common types of secondary upper respiratory infections. Bacterial pneumonia can also complicate a URI. A URI can worsen asthma and chronic obstructive pulmonary disease (COPD). Sometimes, these complications can require emergency medical care and may be life threatening.  CAUSES Almost all URIs are caused by viruses. A virus is a type of germ and can spread from one person to another.  RISKS FACTORS You may be at risk for a URI if:   You smoke.   You have chronic heart or lung disease.  You have a weakened defense (immune) system.   You are very young or very old.   You have nasal allergies or asthma.  You work in crowded or poorly ventilated areas.  You work in health care facilities or schools. SIGNS AND SYMPTOMS  Symptoms typically develop 2-3 days after you come in contact with a cold virus. Most viral URIs last 7-10 days. However, viral URIs from the influenza virus (flu virus) can last 14-18 days and are typically more severe. Symptoms may include:   Runny or stuffy (congested) nose.   Sneezing.   Cough.   Sore throat.   Headache.   Fatigue.   Fever.   Loss of appetite.   Pain in your forehead, behind your eyes, and over your cheekbones (sinus pain).  Muscle aches.  DIAGNOSIS  Your health care provider may diagnose a URI by:  Physical exam.  Tests to check that your symptoms are not due to  another condition such as:  Strep throat.  Sinusitis.  Pneumonia.  Asthma. TREATMENT  A URI goes away on its own with time. It cannot be cured with medicines, but medicines may be prescribed or recommended to relieve symptoms. Medicines may help:  Reduce your fever.  Reduce your cough.  Relieve nasal congestion. HOME CARE INSTRUCTIONS   Take medicines only as directed by your health care provider.   Gargle warm saltwater or take cough drops to comfort your throat as directed by your health care provider.  Use a warm mist humidifier or inhale steam from a shower to increase air moisture. This may make it easier to breathe.  Drink enough fluid to keep your urine clear or pale yellow.   Eat soups and other clear broths and maintain good nutrition.   Rest as needed.   Return to work when your temperature has returned to normal or as your health care provider advises. You may need to stay home longer to avoid infecting others. You can also use a face mask and careful hand washing to prevent spread of the virus.  Increase the usage of your inhaler if you have asthma.   Do not use any tobacco products, including cigarettes, chewing tobacco, or electronic cigarettes. If you need help quitting, ask your health care provider. PREVENTION  The best way to protect yourself from getting a cold is to practice good hygiene.   Avoid oral or hand contact with people with cold   symptoms.   Wash your hands often if contact occurs.  There is no clear evidence that vitamin C, vitamin E, echinacea, or exercise reduces the chance of developing a cold. However, it is always recommended to get plenty of rest, exercise, and practice good nutrition.  SEEK MEDICAL CARE IF:   You are getting worse rather than better.   Your symptoms are not controlled by medicine.   You have chills.  You have worsening shortness of breath.  You have brown or red mucus.  You have yellow or brown nasal  discharge.  You have pain in your face, especially when you bend forward.  You have a fever.  You have swollen neck glands.  You have pain while swallowing.  You have white areas in the back of your throat. SEEK IMMEDIATE MEDICAL CARE IF:   You have severe or persistent:  Headache.  Ear pain.  Sinus pain.  Chest pain.  You have chronic lung disease and any of the following:  Wheezing.  Prolonged cough.  Coughing up blood.  A change in your usual mucus.  You have a stiff neck.  You have changes in your:  Vision.  Hearing.  Thinking.  Mood. MAKE SURE YOU:   Understand these instructions.  Will watch your condition.  Will get help right away if you are not doing well or get worse.   This information is not intended to replace advice given to you by your health care provider. Make sure you discuss any questions you have with your health care provider.   Document Released: 02/27/2001 Document Revised: 01/18/2015 Document Reviewed: 12/09/2013 Elsevier Interactive Patient Education 2016 [employer] Inc.  

## 2015-07-08 NOTE — ED Provider Notes (Signed)
CSN: [id number]     Arrival date & time 07/08/15  1958 History   First MD Initiated Contact with Patient 07/08/15 2104     Chief Complaint  Patient presents with  . Cough  . Fever     (Consider location/radiation/quality/duration/timing/severity/associated sxs/prior Treatment) HPI   Jessica Fleming is a 49 y.o. female with PMH significant for GERD who presents with 2 day history of cough, sore throat, dry cough, congestion, myalgias, facial pain, and b/l ear pain.  Denies HA, neck stiffness, SOB, rhinorrhea, N/V, abdominal pain, or urinary symptoms.  Advil makes it better.  Nothing makes it worse.  Sick contacts at home with similar symptoms.   Past Medical History  Diagnosis Date  . GERD (gastroesophageal reflux disease)    History reviewed. No pertinent past surgical history. History reviewed. No pertinent family history. Social History  Substance Use Topics  . Smoking status: Never Smoker   . Smokeless tobacco: None  . Alcohol Use: No   OB History    No data available     Review of Systems All other systems negative unless otherwise stated in HPI    Allergies  Review of patient's allergies indicates no known allergies.  Home Medications   Prior to Admission medications   Medication Sig Start Date End Date Taking? Authorizing Provider  acetaminophen (TYLENOL) 500 MG tablet Take 1 tablet (500 mg total) by mouth every 6 (six) hours as needed. 07/08/15   Cheri FowlerKayla Lexus Shampine, PA-C  cetirizine (ZYRTEC) 10 MG tablet Take 10 mg by mouth daily.    Historical Provider, MD  fluticasone (FLONASE) 50 MCG/ACT nasal spray Place 2 sprays into both nostrils daily.    Historical Provider, MD  omeprazole (PRILOSEC) 20 MG capsule Take 1 capsule (20 mg total) by mouth daily. 03/14/15   Everlene FarrierWilliam Dansie, PA-C  traMADol (ULTRAM) 50 MG tablet Take 1 tablet (50 mg total) by mouth every 6 (six) hours as needed. 06/11/15   Courteney Lyn Mackuen, MD   BP 129/80 mmHg  Pulse 82  Temp(Src) 96.6 F (35.9  C) (Oral)  SpO2 98%  LMP 06/11/2015 Physical Exam  Constitutional: She is oriented to person, place, and time. She appears well-developed and well-nourished.  HENT:  Head: Normocephalic and atraumatic.    Right Ear: Tympanic membrane normal.  Left Ear: Tympanic membrane normal.  Nose: Nose normal. No rhinorrhea.  Mouth/Throat: Uvula is midline, oropharynx is clear and moist and mucous membranes are normal. No uvula swelling. No oropharyngeal exudate, posterior oropharyngeal edema or posterior oropharyngeal erythema.  Eyes: Conjunctivae are normal. Pupils are equal, round, and reactive to light.  Neck: Normal range of motion. Neck supple. No rigidity.  Cardiovascular: Normal rate, regular rhythm and normal heart sounds.   No murmur heard. Pulmonary/Chest: Effort normal and breath sounds normal. No accessory muscle usage or stridor. No respiratory distress. She has no wheezes. She has no rhonchi. She has no rales.  Abdominal: Soft. Bowel sounds are normal. She exhibits no distension. There is no tenderness.  Musculoskeletal: Normal range of motion.  Lymphadenopathy:    She has no cervical adenopathy.  Neurological: She is alert and oriented to person, place, and time.  Speech clear without dysarthria.  Skin: Skin is warm and dry.  Psychiatric: She has a normal mood and affect. Her behavior is normal.    ED Course  Procedures (including critical care time) Labs Review Labs Reviewed - No data to display  Imaging Review Dg Chest 2 View  07/08/2015  CLINICAL DATA:  Cough,  fever. EXAM: CHEST  2 VIEW COMPARISON:  June 11, 2015. FINDINGS: The heart size and mediastinal contours are within normal limits. Both lungs are clear. No pneumothorax or pleural effusion is noted. The visualized skeletal structures are unremarkable. IMPRESSION: No active cardiopulmonary disease. Electronically Signed   By: Lupita Raider, M.D.   On: 07/08/2015 21:57   I have personally reviewed and evaluated  these images and lab results as part of my medical decision-making.   EKG Interpretation None      MDM   Final diagnoses:  Viral syndrome    Patient presents with facial pain, fever, myalgias, cough, and sore throat.  No neck stiffness or headache. VSS, NAD.  On exam, maxillary and frontal sinus tenderness.  TMs clear b/l.  Oropharynx clear, no exudates, no erythema.  No nuchal rigidity. Centor criteria 1, no tx or testing indicated. Doubt meningitis.  Will obtain CXR to evaluate for PNA.  Given motrin.  Suspect viral etiology.  CXR negative for active pulmonary disease.  Supportive care.  Discussed return precautions.   Patient agrees and acknowledges the above plan.     Cheri Fowler, PA-C 07/08/15 2210  Benjiman Core, MD 07/08/15 2330

## 2016-01-12 ENCOUNTER — Encounter (HOSPITAL_BASED_OUTPATIENT_CLINIC_OR_DEPARTMENT_OTHER): Payer: Self-pay | Admitting: *Deleted

## 2016-01-12 ENCOUNTER — Emergency Department (HOSPITAL_BASED_OUTPATIENT_CLINIC_OR_DEPARTMENT_OTHER)
Admission: EM | Admit: 2016-01-12 | Discharge: 2016-01-12 | Disposition: A | Discharge status: Home / Self Care | Payer: Self-pay | Attending: Emergency Medicine | Admitting: Emergency Medicine

## 2016-01-12 DIAGNOSIS — M542 Cervicalgia: Secondary | ICD-10-CM

## 2016-01-12 DIAGNOSIS — M791 Myalgia: Secondary | ICD-10-CM | POA: Insufficient documentation

## 2016-01-12 MED ORDER — METHOCARBAMOL 500 MG PO TABS: 500.0000 mg | ORAL_TABLET | Freq: Two times a day (BID) | ORAL | Status: DC

## 2016-01-12 MED ORDER — METHOCARBAMOL 500 MG PO TABS
500.0000 mg | ORAL_TABLET | Freq: Once | ORAL | Status: AC
  Administered 2016-01-12: 500 mg via ORAL
  Filled 2016-01-12: qty 1

## 2016-01-12 NOTE — Discharge Instructions (Signed)
Take your medications as prescribed. Do not take this medication in addition to your Tizanidine as they work similarly. Follow-up with your doctor for reevaluation. Return to ED for new or worsening symptoms.

## 2016-01-12 NOTE — ED Notes (Signed)
Pt c/o left neck pain which radiates to left shoulder x 3 months but severe pain x 1 week , denies injury HX tendonitis

## 2016-01-12 NOTE — ED Provider Notes (Signed)
CSN: [id number]     Arrival date & time 01/12/16  2144 History   First MD Initiated Contact with Patient 01/12/16 2312     Chief Complaint  Patient presents with  . Neck Pain     (Consider location/radiation/quality/duration/timing/severity/associated sxs/prior Treatment) HPI Josephine CablesSumera Davia is a 50 y.o. female because in for a violation of left-sided neck pain. Patient reports the symptoms have been ongoing over the past 7 or 8 months. She has tried taking tizanidine and naproxen without relief of her symptoms. She reports she is [place] [facility] [place] community clinic [facility]. She reports worsening discomfort over the past one week that she relates to her job at [employer]. She denies any new numbness or weakness, new vision changes, headache, rash or other medical complaints.  Past Medical History  Diagnosis Date  . GERD (gastroesophageal reflux disease)    History reviewed. No pertinent past surgical history. History reviewed. No pertinent family history. Social History  Substance Use Topics  . Smoking status: Never Smoker   . Smokeless tobacco: None  . Alcohol Use: No   OB History    No data available     Review of Systems A 10 point review of systems was completed and was negative except for pertinent positives and negatives as mentioned in the history of present illness     Allergies  Review of patient's allergies indicates no known allergies.  Home Medications   Prior to Admission medications   Medication Sig Start Date End Date Taking? Authorizing Provider  acetaminophen (TYLENOL) 500 MG tablet Take 1 tablet (500 mg total) by mouth every 6 (six) hours as needed. 07/08/15   Cheri FowlerKayla Rose, PA-C  cetirizine (ZYRTEC) 10 MG tablet Take 10 mg by mouth daily.    Historical Provider, MD  fluticasone (FLONASE) 50 MCG/ACT nasal spray Place 2 sprays into both nostrils daily.    Historical Provider, MD  methocarbamol (ROBAXIN) 500 MG tablet Take 1 tablet (500 mg total) by mouth 2  (two) times daily. 01/12/16   Joycie PeekBenjamin Tuwanda Vokes, PA-C  omeprazole (PRILOSEC) 20 MG capsule Take 1 capsule (20 mg total) by mouth daily. 03/14/15   Everlene FarrierWilliam Dansie, PA-C  traMADol (ULTRAM) 50 MG tablet Take 1 tablet (50 mg total) by mouth every 6 (six) hours as needed. 06/11/15   Courteney Lyn Mackuen, MD   BP 116/69 mmHg  Pulse 70  Temp(Src) 98 F (36.7 C)  Resp 18  Ht 5\' 2"  (1.575 m)  Wt 86.183 kg  BMI 34.74 kg/m2  SpO2 99% Physical Exam  Constitutional: She is oriented to person, place, and time. She appears well-developed and well-nourished.  HENT:  Head: Normocephalic and atraumatic.  Mouth/Throat: Oropharynx is clear and moist.  Eyes: Conjunctivae are normal. Pupils are equal, round, and reactive to light. Right eye exhibits no discharge. Left eye exhibits no discharge. No scleral icterus.  Neck: Neck supple.  Patient has diffuse tenderness to palpation in left paraspinal cervical musculature or trapezius, rhomboids. No midline bony tenderness. She maintains full active range of motion of cervical and thoracic spine. Gait is baseline.  Cardiovascular: Normal rate, regular rhythm and normal heart sounds.   Pulmonary/Chest: Effort normal and breath sounds normal. No respiratory distress. She has no wheezes. She has no rales.  Abdominal: Soft. There is no tenderness.  Musculoskeletal: She exhibits no tenderness.  Neurological: She is alert and oriented to person, place, and time.  Cranial Nerves II-XII grossly intact. Moves all extremities without ataxia. Motor strength is 5/5 in  all 4 and sensation is intact to light touch.  Skin: Skin is warm and dry. No rash noted.  Psychiatric: She has a normal mood and affect.  Nursing note and vitals reviewed.   ED Course  Procedures (including critical care time) Labs Review Labs Reviewed - No data to display  Imaging Review No results found. I have personally reviewed and evaluated these images and lab results as part of my medical  decision-making.   EKG Interpretation None     Meds given in ED:  Medications  methocarbamol (ROBAXIN) tablet 500 mg (500 mg Oral Given 01/12/16 2338)    Discharge Medication List as of 01/12/2016 11:31 PM    START taking these medications   Details  methocarbamol (ROBAXIN) 500 MG tablet Take 1 tablet (500 mg total) by mouth 2 (two) times daily., Starting 01/12/2016, Until Discontinued, Print       Filed Vitals:   01/12/16 2200  BP: 116/69  Pulse: 70  Temp: 98 F (36.7 C)  Resp: 18  Height:  (1.575 m)  Weight: 86.183 kg  SpO2: 99%    MDM  Anabelen Kaminsky is a 50 y.o. female who presents with acute on chronic musculoskeletal neck pain. Nonfocal neuro exam. Discussed DC present in the and start Robaxin therapy. Also encouraged symptomatic support at home with warm showers, heating pad and continued use of naproxen. Overall appears well, nontoxic, hemodynamically stable and appropriate for discharge. Follow up with PCP. Discussed return precautions and medications precautions.  Final diagnoses:  Musculoskeletal neck pain       Joycie Peek, PA-C 01/12/16 2351  Paula Libra, MD 01/12/16 [phone]

## 2016-04-25 ENCOUNTER — Emergency Department (HOSPITAL_BASED_OUTPATIENT_CLINIC_OR_DEPARTMENT_OTHER)
Admission: EM | Admit: 2016-04-25 | Discharge: 2016-04-25 | Disposition: A | Discharge status: Home / Self Care | Payer: Self-pay | Attending: Emergency Medicine | Admitting: Emergency Medicine

## 2016-04-25 ENCOUNTER — Emergency Department (HOSPITAL_BASED_OUTPATIENT_CLINIC_OR_DEPARTMENT_OTHER): Payer: Self-pay

## 2016-04-25 ENCOUNTER — Encounter (HOSPITAL_BASED_OUTPATIENT_CLINIC_OR_DEPARTMENT_OTHER): Payer: Self-pay

## 2016-04-25 DIAGNOSIS — M25562 Pain in left knee: Secondary | ICD-10-CM | POA: Insufficient documentation

## 2016-04-25 MED ORDER — PREDNISONE 10 MG PO TABS: 40.0000 mg | ORAL_TABLET | Freq: Every day | ORAL | 0 refills | Status: DC

## 2016-04-25 MED FILL — predniSONE 10 MG TABS: 10 | 4 days supply | Qty: 16 | Fill #0

## 2016-04-25 NOTE — ED Notes (Signed)
Patient transported to X-ray 

## 2016-04-25 NOTE — ED Notes (Signed)
Pt c/o pain to left knee x 2-3 days. No known injury.

## 2016-04-25 NOTE — ED Provider Notes (Signed)
[facility] Note   CSN: [id number] Arrival date & time: 04/25/16  1259  First Provider Contact:  First MD Initiated Contact with Patient 04/25/16 1328        History   Chief Complaint Chief Complaint  Patient presents with  . Knee Pain    HPI Jessica Fleming is a 50 y.o. female.  The history is provided by the patient. No language interpreter was used.  Knee Pain     Jessica Fleming is a 50 y.o. female who presents to the Emergency Department complaining of knee pain.  She reports 3-4 days of left knee pain. She has some associated swelling. She has pain with flexion of the knee as well as standing for greater than 2 hours. She has a history of tendinitis but never affecting the knee joint. She has been taking naproxen twice a day without any significant improvement in her symptoms. She denies any fever, chest pain, shortness of breath, numbness, weakness. No recent injuries.  Past Medical History:  Diagnosis Date  . GERD (gastroesophageal reflux disease)     There are no active problems to display for this patient.   History reviewed. No pertinent surgical history.  OB History    No data available       Home Medications    Prior to Admission medications   Medication Sig Start Date End Date Taking? Authorizing Provider  predniSONE (DELTASONE) 10 MG tablet Take 4 tablets (40 mg total) by mouth daily. 04/25/16   Tilden Fossa, MD    Family History No family history on file.  Social History Social History  Substance Use Topics  . Smoking status: Never Smoker  . Smokeless tobacco: Never Used  . Alcohol use No     Allergies   Review of patient's allergies indicates no known allergies.   Review of Systems Review of Systems  All other systems reviewed and are negative.    Physical Exam Updated Vital Signs BP 105/66 (BP Location: Right Arm)   Pulse 72   Temp 98.6 F (37 C) (Oral)   Resp 18   Ht  (1.6 m)   Wt 195 lb (88.5 kg)    LMP 04/25/2016   SpO2 98%   BMI 34.54 kg/m   Physical Exam  Constitutional: She is oriented to person, place, and time. She appears well-developed and well-nourished.  HENT:  Head: Normocephalic and atraumatic.  Cardiovascular: Normal rate and regular rhythm.   No murmur heard. Pulmonary/Chest: Effort normal and breath sounds normal. No respiratory distress.  Musculoskeletal:  Left knee with mild diffuse anterior tenderness to palpation with mild local swelling.  No erythema or rash. 2+ DP pulses bilaterally.  Neurological: She is alert and oriented to person, place, and time.  Skin: Skin is warm and dry.  Psychiatric: She has a normal mood and affect. Her behavior is normal.  Nursing note and vitals reviewed.    ED Treatments / Results  Labs (all labs ordered are listed, but only abnormal results are displayed) Labs Reviewed - No data to display  EKG  EKG Interpretation None       Radiology Dg Knee Complete 4 Views Left  Result Date: 04/25/2016 CLINICAL DATA:  Left knee pain for 2 days, no known injury, initial encounter EXAM: LEFT KNEE - COMPLETE 4+ VIEW COMPARISON:  None. FINDINGS: No evidence of fracture, dislocation, or joint effusion. No evidence of arthropathy or other focal bone abnormality. Soft tissues are unremarkable. IMPRESSION: No acute abnormality noted. Electronically  Signed   By: Alcide CleverMark  Lukens M.D.   On: 04/25/2016 13:38    Procedures Procedures (including critical care time)  Medications Ordered in ED Medications - No data to display   Initial Impression / Assessment and Plan / ED Course  I have reviewed the triage vital signs and the nursing notes.  Pertinent labs & imaging results that were available during my care of the patient were reviewed by me and considered in my medical decision making (see chart for details).  Clinical Course    Patient here for evaluation of nontraumatic left knee pain. Examination is not consistent with septic  arthritis, gouty arthritis, DVT. She is neurovascularly intact on examination. Plan to treat for inflammatory arthritis with steroid, continue naproxen at home, outpatient follow-up, return precautions.  Final Clinical Impressions(s) / ED Diagnoses   Final diagnoses:  Arthralgia of left knee    New Prescriptions New Prescriptions   PREDNISONE (DELTASONE) 10 MG TABLET    Take 4 tablets (40 mg total) by mouth daily.     Tilden FossaElizabeth Jabrea Kallstrom, MD 04/25/16 [phone]

## 2016-04-25 NOTE — ED Triage Notes (Signed)
C/o left knee pain x 3-4 days-denies injury-NAD

## 2016-08-18 ENCOUNTER — Emergency Department (HOSPITAL_BASED_OUTPATIENT_CLINIC_OR_DEPARTMENT_OTHER)
Admission: EM | Admit: 2016-08-18 | Discharge: 2016-08-18 | Disposition: A | Discharge status: Home / Self Care | Payer: Self-pay | Attending: Emergency Medicine | Admitting: Emergency Medicine

## 2016-08-18 ENCOUNTER — Emergency Department (HOSPITAL_BASED_OUTPATIENT_CLINIC_OR_DEPARTMENT_OTHER): Payer: Self-pay

## 2016-08-18 ENCOUNTER — Encounter (HOSPITAL_BASED_OUTPATIENT_CLINIC_OR_DEPARTMENT_OTHER): Payer: Self-pay | Admitting: *Deleted

## 2016-08-18 DIAGNOSIS — R509 Fever, unspecified: Secondary | ICD-10-CM | POA: Insufficient documentation

## 2016-08-18 DIAGNOSIS — R059 Cough, unspecified: Secondary | ICD-10-CM

## 2016-08-18 DIAGNOSIS — R05 Cough: Secondary | ICD-10-CM | POA: Insufficient documentation

## 2016-08-18 DIAGNOSIS — R51 Headache: Secondary | ICD-10-CM | POA: Insufficient documentation

## 2016-08-18 DIAGNOSIS — M791 Myalgia: Secondary | ICD-10-CM | POA: Insufficient documentation

## 2016-08-18 DIAGNOSIS — R0602 Shortness of breath: Secondary | ICD-10-CM | POA: Insufficient documentation

## 2016-08-18 MED ORDER — ACETAMINOPHEN 325 MG PO TABS
650.0000 mg | ORAL_TABLET | Freq: Once | ORAL | Status: AC
  Administered 2016-08-18: 650 mg via ORAL
  Filled 2016-08-18: qty 2

## 2016-08-18 MED ORDER — ALBUTEROL SULFATE HFA 108 (90 BASE) MCG/ACT IN AERS
1.0000 | INHALATION_SPRAY | Freq: Four times a day (QID) | RESPIRATORY_TRACT | Status: DC | PRN
  Administered 2016-08-18: 2 via RESPIRATORY_TRACT
  Filled 2016-08-18: qty 6.7

## 2016-08-18 MED ORDER — BENZONATATE 100 MG PO CAPS: 100.0000 mg | ORAL_CAPSULE | Freq: Three times a day (TID) | ORAL | 0 refills | Status: DC

## 2016-08-18 NOTE — ED Triage Notes (Signed)
Cough x 4 days.  Increased head pain and chest pain only when coughing.

## 2016-08-18 NOTE — ED Provider Notes (Signed)
[facility]   CSN: [id number] Arrival date & time: 08/18/16  1620 By signing my name below, I, Levon HedgerElizabeth Hall, attest that this documentation has been prepared under the direction and in the presence of non-physician practitioner, Arvilla MeresAshley Wendle Kina, PA-C. Electronically Signed: Levon HedgerElizabeth Hall, Scribe. 08/18/2016. 5:05 PM.   History   Chief Complaint Chief Complaint  Patient presents with  . Cough   HPI Jessica Fleming is a 50 y.o. female who presents to the Emergency Department complaining of intermittent, worsening coughing which began four days ago. Per pt, the cough was productive of sputum a few days ago but this has resolved. She notes associated chills, shortness of breath, frontal headache, anterior neck pain, myalgias, chest discomfort only during cough, and fever (tmax 101) yesterday.  She has taken OTC cough medication and Advil with minimal relief. She has not received a flu shot this year. She denies any chest tightness, wheezing, sinus pressure, rhinorrhea, sneezing, watery eyes, lower leg swelling, nausea, vomiting, abdominal pain, rashes, dysuria, hematuria, or change in vision. No h/o DM, HTN, or high cholesterol. No recent travel, surgeries, or hospitalization. No known sick contacts.   The history is provided by the patient. No language interpreter was used.   Past Medical History:  Diagnosis Date  . GERD (gastroesophageal reflux disease)     There are no active problems to display for this patient.  History reviewed. No pertinent surgical history.  OB History    No data available      Home Medications    Prior to Admission medications   Medication Sig Start Date End Date Taking? Authorizing Provider  benzonatate (TESSALON) 100 MG capsule Take 1 capsule (100 mg total) by mouth every 8 (eight) hours. 08/18/16   Lona KettleAshley Laurel Kyngston Pickelsimer, PA-C    Family History History reviewed. No pertinent family history.  Social History Social History    Substance Use Topics  . Smoking status: Never Smoker  . Smokeless tobacco: Never Used  . Alcohol use No    Allergies   Patient has no known allergies.  Review of Systems Review of Systems  Constitutional: Positive for chills and fever. Negative for diaphoresis.  HENT: Negative for rhinorrhea, sinus pressure, sneezing, sore throat and trouble swallowing.   Eyes: Negative for visual disturbance.  Respiratory: Positive for cough and shortness of breath. Negative for chest tightness.   Cardiovascular: Negative for chest pain and leg swelling.  Gastrointestinal: Negative for abdominal pain, nausea and vomiting.  Genitourinary: Negative for dysuria and hematuria.  Musculoskeletal: Positive for myalgias.  Skin: Negative for rash.  Neurological: Positive for headaches.   Physical Exam Updated Vital Signs BP 138/84 (BP Location: Left Arm)   Pulse 86   Temp 98.1 F (36.7 C) (Oral)   Resp 20   Ht 5\' 1"  (1.549 m)   Wt 190 lb (86.2 kg)   SpO2 95%   BMI 35.90 kg/m   Physical Exam  Constitutional: She appears well-developed and well-nourished. No distress.  HENT:  Head: Normocephalic and atraumatic.  Right Ear: Tympanic membrane, external ear and ear canal normal.  Left Ear: Tympanic membrane, external ear and ear canal normal.  Nose: Nose normal.  Mouth/Throat: Uvula is midline, oropharynx is clear and moist and mucous membranes are normal. No trismus in the jaw. No oropharyngeal exudate. No tonsillar exudate.  Eyes: Conjunctivae and EOM are normal. Pupils are equal, round, and reactive to light. Right eye exhibits no discharge. Left eye exhibits no discharge. No scleral icterus.  Neck:  Normal range of motion and phonation normal. Neck supple. No neck rigidity. Normal range of motion present.  No nuchal rigidity. Neck ROM intact.   Cardiovascular: Normal rate, regular rhythm, normal heart sounds and intact distal pulses.   No murmur heard. Pulmonary/Chest: Effort normal and breath  sounds normal. No stridor. No respiratory distress. She has no wheezes. She has no rales. She exhibits tenderness.    Symmetric chest expansion. Respirations unlabored. No wheezing or rales. No hypoxia. Reproducible chest wall tenderness.   Abdominal: Soft. Bowel sounds are normal. She exhibits no distension. There is no tenderness.  Musculoskeletal: Normal range of motion.  Lymphadenopathy:    She has no cervical adenopathy.  Neurological: She is alert. She is not disoriented. Coordination and gait normal. GCS eye subscore is 4. GCS verbal subscore is 5. GCS motor subscore is 6.  Skin: Skin is warm and dry. She is not diaphoretic.  Psychiatric: She has a normal mood and affect. Her behavior is normal.   ED Treatments / Results  DIAGNOSTIC STUDIES:  Oxygen Saturation is 95% on RA, normal by my interpretation.    COORDINATION OF CARE:  5:04 PM Discussed treatment plan with pt at bedside and pt agreed to plan.   Labs (all labs ordered are listed, but only abnormal results are displayed) Labs Reviewed - No data to display  EKG  EKG Interpretation None       Radiology Dg Chest 2 View  Result Date: 08/18/2016 CLINICAL DATA:  Cough and chest congestion for the past 4-5 days. Chills. EXAM: CHEST  2 VIEW COMPARISON:  07/08/2015. FINDINGS: Normal sized heart. Clear lungs. Lower thoracic spine degenerative changes. IMPRESSION: No acute abnormality. Electronically Signed   By: Beckie Salts M.D.   On: 08/18/2016 17:26    Procedures Procedures (including critical care time)  Medications Ordered in ED Medications  albuterol (PROVENTIL HFA;VENTOLIN HFA) 108 (90 Base) MCG/ACT inhaler 1-2 puff (2 puffs Inhalation Given 08/18/16 1904)  acetaminophen (TYLENOL) tablet 650 mg (650 mg Oral Given 08/18/16 1736)   Vitals:   08/18/16 1629 08/18/16 1631 08/18/16 1844 08/18/16 1904  BP:  138/84 141/75   Pulse:  86 72   Resp:  20 16   Temp:  98.1 F (36.7 C)    TempSrc:  Oral    SpO2:  95%  98% 100%  Weight: 86.2 kg     Height: 5\' 1"  (1.549 m)        Initial Impression / Assessment and Plan / ED Course  I have reviewed the triage vital signs and the nursing notes.  Pertinent labs & imaging results that were available during my care of the patient were reviewed by me and considered in my medical decision making (see chart for details).  Clinical Course as of Aug 18 2002  Sat Aug 18, 2016  1745 Normal cardiac silhouette. No evidence of consolidation, effusion, or PTX. No free air under diaphragm.  DG Chest 2 View [AM]  1750 On re-evaluation patient endorses improvement in pain. She is ready to go home.   [AM]    Clinical Course User Index [AM] Lona Kettle, PA-C    Patient presents to ED with complaint of cough x 5 days. Patient is afebrile and non-toxic appearing in NAD. VSS. Respirations are unlabored. Symmetric chest wall expansion. Lungs are CTABL. No wheezing. No hypoxia. Heart RRR. Reproducible TTP of chest wall, the same sensation she experiences when she coughs. CXR negative for PNA, pleural effusion, or PTX. ?viral URI vs. ?  bronchitis. Pt endorses improvement in pain following pain medication. Discussed results and plan with patient. Inhaler provided. Rx tessalon perles for cough relief. Symptomatic management discussed. Follow up with PCP early next week for re-evaluation. Return precautions discussed. Pt voiced understanding and is agreeable.   Final Clinical Impressions(s) / ED Diagnoses   Final diagnoses:  Cough    New Prescriptions Discharge Medication List as of 08/18/2016  7:02 PM    START taking these medications   Details  benzonatate (TESSALON) 100 MG capsule Take 1 capsule (100 mg total) by mouth every 8 (eight) hours., Starting Sat 08/18/2016, Print      I personally performed the services described in this documentation, which was scribed in my presence. The recorded information has been reviewed and is accurate.    Lona Kettleshley Laurel Makaelah Cranfield,  [place]PA-C 08/18/16 2004    Tilden FossaElizabeth Rees, MD 08/19/16 1356

## 2016-08-18 NOTE — Discharge Instructions (Signed)
Read the information below.  Your chest x-ray did not show any evidence of pneumonia. You may have bronchitis or a viral upper respiratory infection.  Please take tylenol 650mg  every 6hrs or motrin 400mg  every 6hrs for pain relief and fever reduction.  I have prescribed tessalon perles for cough relief. Try warm liquids or honey as well for cough relief. I have also provided an inhaler for relief of shortness of breath.  Use the prescribed medication as directed.  Please discuss all new medications with your pharmacist.   Please follow up with your primary doctor early next week for re-evaluation.  You may return to the Emergency Department at any time for worsening condition or any new symptoms that concern you. Return to ED if you develop worsening cough, difficulty breathing, chest pain, lower leg swelling, or any other new/concerning symptoms.

## 2016-09-07 ENCOUNTER — Encounter (HOSPITAL_BASED_OUTPATIENT_CLINIC_OR_DEPARTMENT_OTHER): Payer: Self-pay

## 2016-09-07 ENCOUNTER — Emergency Department (HOSPITAL_BASED_OUTPATIENT_CLINIC_OR_DEPARTMENT_OTHER)
Admission: EM | Admit: 2016-09-07 | Discharge: 2016-09-07 | Disposition: A | Discharge status: Home / Self Care | Payer: Medicaid Other | Attending: Emergency Medicine | Admitting: Emergency Medicine

## 2016-09-07 ENCOUNTER — Emergency Department (HOSPITAL_BASED_OUTPATIENT_CLINIC_OR_DEPARTMENT_OTHER): Payer: Medicaid Other

## 2016-09-07 DIAGNOSIS — X58XXXA Exposure to other specified factors, initial encounter: Secondary | ICD-10-CM | POA: Insufficient documentation

## 2016-09-07 DIAGNOSIS — Y999 Unspecified external cause status: Secondary | ICD-10-CM | POA: Insufficient documentation

## 2016-09-07 DIAGNOSIS — Y939 Activity, unspecified: Secondary | ICD-10-CM | POA: Insufficient documentation

## 2016-09-07 DIAGNOSIS — S8392XA Sprain of unspecified site of left knee, initial encounter: Secondary | ICD-10-CM | POA: Insufficient documentation

## 2016-09-07 DIAGNOSIS — Y929 Unspecified place or not applicable: Secondary | ICD-10-CM | POA: Insufficient documentation

## 2016-09-07 DIAGNOSIS — Z79899 Other long term (current) drug therapy: Secondary | ICD-10-CM | POA: Insufficient documentation

## 2016-09-07 MED ORDER — TRAMADOL HCL 50 MG PO TABS: 50.0000 mg | ORAL_TABLET | Freq: Four times a day (QID) | ORAL | 0 refills | Status: DC | PRN

## 2016-09-07 MED ORDER — TRAMADOL HCL 50 MG PO TABS
50.0000 mg | ORAL_TABLET | Freq: Once | ORAL | Status: AC
  Administered 2016-09-07: 50 mg via ORAL
  Filled 2016-09-07: qty 1

## 2016-09-07 MED FILL — traMADol HCL 50 MG TABS: 50 | 2 days supply | Qty: 10 | Fill #0

## 2016-09-07 NOTE — ED Triage Notes (Signed)
Left knee pain x 1 week-denies injury-same pain 6 mos ago-no dx-NAD-limping gait

## 2016-09-07 NOTE — Discharge Instructions (Signed)
Try and stay off your leg.   Continue motrin for pain.  Take tramadol for severe pain. DO NOT drive with it.   See orthopedic doctor. You may need further imaging to diagnose the problem   Return to ER if you have severe pain, worse knee swelling, unable to walk.

## 2016-09-07 NOTE — ED Provider Notes (Signed)
[facility]   CSN: [id number] Arrival date & time: 09/07/16  1122     History   Chief Complaint Chief Complaint  Patient presents with  . Knee Pain    HPI Josephine CablesSumera Kalama is a 50 y.o. female hx of reflux here with L knee pain. Left knee pain that is worse at night over the last week or so. Patient states that he wakes her up from sleep. Patient states that it is worse in the medial aspect of the knee. Denies any falls or injuries to knee. Patient states that it's worse when she tries to bear weight on it but is able to walk. She has been trying Tylenol or Motrin with minimal relief. Patient had knee pain several months ago but did not see orthopedic doctor.   The history is provided by the patient.    Past Medical History:  Diagnosis Date  . GERD (gastroesophageal reflux disease)     There are no active problems to display for this patient.   History reviewed. No pertinent surgical history.  OB History    No data available       Home Medications    Prior to Admission medications   Medication Sig Start Date End Date Taking? Authorizing Provider  NAPROXEN PO Take by mouth.   Yes Historical Provider, MD  TIZANIDINE HCL PO Take by mouth.   Yes Historical Provider, MD    Family History No family history on file.  Social History Social History  Substance Use Topics  . Smoking status: Never Smoker  . Smokeless tobacco: Never Used  . Alcohol use No     Allergies   Patient has no known allergies.   Review of Systems Review of Systems  Musculoskeletal:       L knee pain   All other systems reviewed and are negative.    Physical Exam Updated Vital Signs BP 145/81 (BP Location: Left Arm)   Pulse 71   Temp 97.7 F (36.5 C) (Oral)   Resp 18   Ht 5\' 2"  (1.575 m)   Wt 190 lb (86.2 kg)   LMP 08/24/2016   SpO2 98%   BMI 34.75 kg/m   Physical Exam  Constitutional: She appears well-developed.  HENT:  Head: Normocephalic.    Eyes: Pupils are equal, round, and reactive to light.  Neck: Normal range of motion.  Cardiovascular: Normal rate.   Pulmonary/Chest: Effort normal.  Abdominal: Soft.  Musculoskeletal:  L knee small effusion, tenderness over medial aspect. No obvious deformity.   Neurological: She is alert.  Skin: Skin is warm.  Psychiatric: She has a normal mood and affect.  Nursing note and vitals reviewed.    ED Treatments / Results  Labs (all labs ordered are listed, but only abnormal results are displayed) Labs Reviewed - No data to display  EKG  EKG Interpretation None       Radiology Dg Knee Complete 4 Views Left  Result Date: 09/07/2016 CLINICAL DATA:  Left knee pain.  No known injury. EXAM: LEFT KNEE - COMPLETE 4+ VIEW COMPARISON:  06/20/2016 . FINDINGS: No acute bony or joint abnormality identified. No evidence of fracture dislocation. IMPRESSION: No acute or focal abnormality. Electronically Signed   By: Maisie Fushomas  Register   On: 09/07/2016 12:17    Procedures Procedures (including critical care time)  Medications Ordered in ED Medications  traMADol (ULTRAM) tablet 50 mg (not administered)     Initial Impression / Assessment and Plan / ED Course  I have reviewed the triage vital signs and the nursing notes.  Pertinent labs & imaging results that were available during my care of the patient were reviewed by me and considered in my medical decision making (see chart for details).  Clinical Course    Josephine CablesSumera Cutrone is a 50 y.o. female here with L knee pain. No injury. Xray showed no obvious fracture. Able to bear weight on it. Likely ligamentous strain vs meniscus injury. Offered crutches and knee immobilizer but she refused. Will give tramadol prn pain, ortho referral. May benefit from MRI for further evaluation.   Final Clinical Impressions(s) / ED Diagnoses   Final diagnoses:  None    New Prescriptions New Prescriptions   No medications on file     Charlynne Panderavid Hsienta  Yao, MD 09/07/16 1229

## 2016-09-19 ENCOUNTER — Encounter: Payer: Self-pay | Admitting: Family Medicine

## 2016-09-19 ENCOUNTER — Ambulatory Visit (INDEPENDENT_AMBULATORY_CARE_PROVIDER_SITE_OTHER): Payer: Self-pay | Admitting: Family Medicine

## 2016-09-19 VITALS — BP 129/85 | HR 75 | Ht 61.0 in | Wt 190.0 lb

## 2016-09-19 DIAGNOSIS — G8929 Other chronic pain: Secondary | ICD-10-CM

## 2016-09-19 DIAGNOSIS — M25562 Pain in left knee: Secondary | ICD-10-CM

## 2016-09-19 MED ORDER — METHYLPREDNISOLONE ACETATE 40 MG/ML IJ SUSP
40.0000 mg | Freq: Once | INTRAMUSCULAR | Status: AC
  Administered 2016-09-19: 40 mg via INTRA_ARTICULAR

## 2016-09-19 NOTE — Patient Instructions (Signed)
Your knee pain is either due to arthritis or a degenerative medial meniscus tear. Both are treated similarly. These are the different classes of medicine you can take: Tylenol 500mg  1-2 tabs three times a day for pain. Aleve 1-2 tabs twice a day with food Glucosamine sulfate 750mg  twice a day is a supplement that may help. Capsaicin, aspercreme, or biofreeze topically up to four times a day may also help with pain. Cortisone injections are an option - you were given this today. If cortisone injections do not help, there are different types of shots that may help but they take longer to take effect. It's important that you continue to stay active. Straight leg raises, knee extensions 3 sets of 10 once a day (add ankle weight if these become too easy). Consider physical therapy to strengthen muscles around the joint that hurts to take pressure off of the joint itself. Shoe inserts with good arch support may be helpful. Walker or cane if needed. Heat or ice 15 minutes at a time 3-4 times a day as needed to help with pain. Water aerobics and cycling with low resistance are the best two types of exercise for arthritis. Follow up with me in 6 weeks for reevaluation.

## 2016-09-20 DIAGNOSIS — M25562 Pain in left knee: Secondary | ICD-10-CM | POA: Insufficient documentation

## 2016-09-20 NOTE — Progress Notes (Signed)
PCP: No PCP Per Patient  Subjective:   HPI: Patient is a 51 y.o. female here for left knee pain.  Patient reports she's had current left knee pain for at least a month. Was seen for this knee back in August as well in the ED. Pain is medial aspect of knee but can radiate down the leg. Using ice/heat and ben gay. Pain is 7/10, sharp and stiff. Tried naproxen also. No skin changes, numbness. Pain worse when standing.  Past Medical History:  Diagnosis Date  . GERD (gastroesophageal reflux disease)     Current Outpatient Prescriptions on File Prior to Visit  Medication Sig Dispense Refill  . NAPROXEN PO Take by mouth.    . traMADol (ULTRAM) 50 MG tablet Take 1 tablet (50 mg total) by mouth every 6 (six) hours as needed. 10 tablet 0   No current facility-administered medications on file prior to visit.     No past surgical history on file.  Allergies  Allergen Reactions  . Ibuprofen Other (See Comments)    Stomach pain  . Fish Oil     Back pain  . Hydrocodone-Homatropine Other (See Comments)    Sweating, hypotension  . Prednisone Other (See Comments)    Stomach pain, redness of the oral mucosa    Social History   Social History  . Marital status: Married    Spouse name: N/A  . Number of children: N/A  . Years of education: N/A   Occupational History  . Not on file.   Social History Main Topics  . Smoking status: Never Smoker  . Smokeless tobacco: Never Used  . Alcohol use No  . Drug use: No  . Sexual activity: Not on file   Other Topics Concern  . Not on file   Social History Narrative  . No narrative on file    No family history on file.  BP 129/85   Pulse 75   Ht 5\' 1"  (1.549 m)   Wt 190 lb (86.2 kg)   LMP 08/24/2016   BMI 35.90 kg/m   Review of Systems: See HPI above.     Objective:  Physical Exam:  Gen: NAD, comfortable in exam room  Left knee: No gross deformity, ecchymoses, effusion. TTP medial joint line.  No other  tenderness. FROM. Negative ant/post drawers. Negative valgus/varus testing. Negative lachmanns. Negative mcmurrays, apleys, patellar apprehension. NV intact distally.  Right knee: FROM without pain.   Assessment & Plan:  1. 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  Given history and exam suspect her pain is due to this arthritis or a degenerative medial meniscus tear.  We discussed tylenol, aleve, glucosamine, topical medications.  She opted for cortisone injection today.  Shown home exercises to do daily.  Heat/ice.  F/u in 6 weeks.  After informed written consent, patient was seated on exam table. Left knee was prepped with alcohol swab and utilizing anteromedial approach, patient's left knee was injected intraarticularly with 3:1 bupivicaine: depomedrol. Patient tolerated the procedure well without immediate complications.

## 2016-09-20 NOTE — Assessment & Plan Note (Signed)
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  Given history and exam suspect her pain is due to this arthritis or a degenerative medial meniscus tear.  We discussed tylenol, aleve, glucosamine, topical medications.  She opted for cortisone injection today.  Shown home exercises to do daily.  Heat/ice.  F/u in 6 weeks.  After informed written consent, patient was seated on exam table. Left knee was prepped with alcohol swab and utilizing anteromedial approach, patient's left knee was injected intraarticularly with 3:1 bupivicaine: depomedrol. Patient tolerated the procedure well without immediate complications.

## 2017-05-22 ENCOUNTER — Encounter (HOSPITAL_BASED_OUTPATIENT_CLINIC_OR_DEPARTMENT_OTHER): Payer: Self-pay

## 2017-05-22 ENCOUNTER — Emergency Department (HOSPITAL_BASED_OUTPATIENT_CLINIC_OR_DEPARTMENT_OTHER)
Admission: EM | Admit: 2017-05-22 | Discharge: 2017-05-22 | Disposition: A | Discharge status: Home / Self Care | Payer: Self-pay | Attending: Emergency Medicine | Admitting: Emergency Medicine

## 2017-05-22 DIAGNOSIS — N12 Tubulo-interstitial nephritis, not specified as acute or chronic: Secondary | ICD-10-CM

## 2017-05-22 DIAGNOSIS — Z79899 Other long term (current) drug therapy: Secondary | ICD-10-CM | POA: Insufficient documentation

## 2017-05-22 DIAGNOSIS — N10 Acute tubulo-interstitial nephritis: Secondary | ICD-10-CM | POA: Insufficient documentation

## 2017-05-22 LAB — COMPREHENSIVE METABOLIC PANEL
ALT: 31 U/L (ref 14–54)
AST: 28 U/L (ref 15–41)
Albumin: 3.9 g/dL (ref 3.5–5.0)
Alkaline Phosphatase: 79 U/L (ref 38–126)
Anion gap: 7 (ref 5–15)
BUN: 14 mg/dL (ref 6–20)
CHLORIDE: 105 mmol/L (ref 101–111)
CO2: 26 mmol/L (ref 22–32)
CREATININE: 0.64 mg/dL (ref 0.44–1.00)
Calcium: 8.9 mg/dL (ref 8.9–10.3)
GFR calc Af Amer: >60 mL/min (ref >60)
Glucose, Bld: 91 mg/dL (ref 65–99)
POTASSIUM: 3.8 mmol/L (ref 3.5–5.1)
Sodium: 138 mmol/L (ref 135–145)
Total Bilirubin: 0.2 mg/dL — ABNORMAL LOW (ref 0.3–1.2)
Total Protein: 8 g/dL (ref 6.5–8.1)

## 2017-05-22 LAB — URINALYSIS, ROUTINE W REFLEX MICROSCOPIC
BILIRUBIN URINE: NEGATIVE
Glucose, UA: NEGATIVE mg/dL
KETONES UR: NEGATIVE mg/dL
Nitrite: NEGATIVE
PROTEIN: NEGATIVE mg/dL
Specific Gravity, Urine: 1.02 (ref 1.005–1.030)
pH: 6 (ref 5.0–8.0)

## 2017-05-22 LAB — URINALYSIS, MICROSCOPIC (REFLEX)

## 2017-05-22 LAB — CBC
HEMATOCRIT: 35.8 % — AB (ref 36.0–46.0)
Hemoglobin: 11.7 g/dL — ABNORMAL LOW (ref 12.0–15.0)
MCH: 28.1 pg (ref 26.0–34.0)
MCHC: 32.7 g/dL (ref 30.0–36.0)
MCV: 86.1 fL (ref 78.0–100.0)
PLATELETS: 281 10*3/uL (ref 150–400)
RBC: 4.16 MIL/uL (ref 3.87–5.11)
RDW: 14 % (ref 11.5–15.5)
WBC: 7.5 10*3/uL (ref 4.0–10.5)

## 2017-05-22 LAB — PREGNANCY, URINE: Preg Test, Ur: NEGATIVE

## 2017-05-22 MED ORDER — DEXTROSE 5 % IV SOLN
1.0000 g | Freq: Once | INTRAVENOUS | Status: AC
  Administered 2017-05-22: 1 g via INTRAVENOUS
  Filled 2017-05-22: qty 10

## 2017-05-22 MED ORDER — PROMETHAZINE HCL 25 MG PO TABS: 25.0000 mg | ORAL_TABLET | Freq: Four times a day (QID) | ORAL | 0 refills | Status: DC | PRN

## 2017-05-22 MED ORDER — FENTANYL CITRATE (PF) 100 MCG/2ML IJ SOLN
50.0000 ug | Freq: Once | INTRAMUSCULAR | Status: AC
  Administered 2017-05-22: 50 ug via INTRAVENOUS
  Filled 2017-05-22: qty 2

## 2017-05-22 MED ORDER — KETOROLAC TROMETHAMINE 30 MG/ML IJ SOLN
30.0000 mg | Freq: Once | INTRAMUSCULAR | Status: AC
  Administered 2017-05-22: 30 mg via INTRAVENOUS
  Filled 2017-05-22: qty 1

## 2017-05-22 MED ORDER — OXYCODONE-ACETAMINOPHEN 5-325 MG PO TABS
1.0000 | ORAL_TABLET | Freq: Four times a day (QID) | ORAL | 0 refills | Status: DC | PRN
Start: 2017-05-22 — End: 2018-11-20

## 2017-05-22 MED ORDER — CEPHALEXIN 500 MG PO CAPS: 500.0000 mg | ORAL_CAPSULE | Freq: Four times a day (QID) | ORAL | 0 refills | Status: AC

## 2017-05-22 NOTE — ED Triage Notes (Signed)
C/o bilat flank pain x 3-4 days-[facility]dx with UTI started abx-c/o pain is worse-NAD-steady gait

## 2017-05-22 NOTE — ED Notes (Signed)
ED Provider at bedside. 

## 2017-05-22 NOTE — ED Provider Notes (Signed)
[facility]-EMERGENCY DEPT Provider Note   CSN: [id number] Arrival date & time: 05/22/17  1654     History   Chief Complaint Chief Complaint  Patient presents with  . Flank Pain    HPI Jessica Fleming is a 51 y.o. female.  51yo F w/ PMH including HLD who p/w back pain. She reports 3-4 days of back pain initially on L side now on both sides, currently severe and constant. She reports several days of dysuria. She went to PCP yesterday and was diagnosed w/ UTI, started on bactrim. Dysuria has mildly improved but back pain is persistent. She has had nausea, decreased appetite, she has been drinking a lot of fluids. She reports associated headache. No fevers. No medications except antibiotics PTA.    The history is provided by the patient.    Past Medical History:  Diagnosis Date  . GERD (gastroesophageal reflux disease)     Patient Active Problem List   Diagnosis Date Noted  . Left knee pain 09/20/2016  . 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 Morbid obesity due to excess calories (HCC) 10/25/2014  . Allergic rhinitis 04/12/2014  . B12 deficiency 04/12/2014  . Presbyopia 07/06/2013  . 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 Hypercholesterolemia 02/21/2013  . 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 No pertinent surgical history.  OB History    No data available       Home Medications    Prior to Admission medications   Medication Sig Start Date End Date Taking? Authorizing Provider  sulfamethoxazole-trimethoprim (BACTRIM DS,SEPTRA DS) 800-160 MG tablet Take 1 tablet by mouth 2 (two) times daily.   Yes [provider]  cephALEXin (KEFLEX) 500 MG capsule Take 1 capsule (500 mg total) by mouth 4 (four) times daily. 05/22/17 06/01/17  Lavaya Defreitas, Ambrose Finland, MD  NAPROXEN PO Take by mouth.    [provider]  oxyCODONE-acetaminophen (PERCOCET) 5-325 MG tablet Take 1 tablet by mouth every 6 (six) hours as needed. 05/22/17   Cheyanne Lamison, Ambrose Finland, MD  promethazine (PHENERGAN) 25 MG tablet Take 1 tablet (25 mg total) by mouth every 6 (six) hours as needed for nausea or vomiting. 05/22/17   Jadae Steinke, Ambrose Finland, MD  tiZANidine (ZANAFLEX) 4 MG capsule 1/2 - 1 tab by mouth three times a day as needed for muscle spasms    [provider]  traMADol (ULTRAM) 50 MG tablet Take 1 tablet (50 mg total) by mouth every 6 (six) hours as needed. 09/07/16   Charlynne Pander, MD    Family History No family history on file.  Social History Social History  Substance Use Topics  . Smoking status: Never Smoker  . Smokeless tobacco: Never Used  . Alcohol use No     Allergies   Ibuprofen; Fish oil; Hydrocodone-homatropine; and Prednisone   Review of Systems Review of Systems All other systems reviewed and are negative except that which was mentioned in HPI   Physical Exam Updated Vital Signs BP 115/67 (BP Location: Left Arm)   Pulse (!) 56   Temp 98.5 F (36.9 C) (Oral)   Resp 18   Ht 5\' 2"  (1.575 m)   Wt 96.9 kg (213 lb 10 oz)   LMP 05/08/2017   SpO2 98%   BMI 39.07 kg/m   Physical Exam  Constitutional: She is oriented to person, place, and time. She appears  well-developed and well-nourished.  Very uncomfortable  HENT:  Head: Normocephalic and atraumatic.  Moist mucous membranes  Eyes: Pupils are equal, round, and reactive to light. Conjunctivae are normal.  Neck: Neck supple.  Cardiovascular: Normal rate, regular rhythm and normal heart sounds.   No murmur heard. Pulmonary/Chest: Effort normal and breath sounds normal.  Abdominal: Soft. Bowel sounds are normal. She exhibits no distension. There is no tenderness.  Musculoskeletal: She exhibits no edema.  + b/l CVA tenderness  Neurological: She is alert and oriented to person, place, and time.  Fluent speech  Skin: Skin is  warm and dry.  Psychiatric: She has a normal mood and affect. Judgment normal.  Nursing note and vitals reviewed.    ED Treatments / Results  Labs (all labs ordered are listed, but only abnormal results are displayed) Labs Reviewed  URINALYSIS, ROUTINE W REFLEX MICROSCOPIC - Abnormal; Notable for the following:       Result Value   APPearance CLOUDY (*)    Hgb urine dipstick LARGE (*)    Leukocytes, UA SMALL (*)    All other components within normal limits  URINALYSIS, MICROSCOPIC (REFLEX) - Abnormal; Notable for the following:    Bacteria, UA MANY (*)    Squamous Epithelial / LPF 6-30 (*)    All other components within normal limits  COMPREHENSIVE METABOLIC PANEL - Abnormal; Notable for the following:    Total Bilirubin 0.2 (*)    All other components within normal limits  CBC - Abnormal; Notable for the following:    Hemoglobin 11.7 (*)    HCT 35.8 (*)    All other components within normal limits  URINE CULTURE  PREGNANCY, URINE    EKG  EKG Interpretation None       Radiology No results found.  Procedures Procedures (including critical care time)  Medications Ordered in ED Medications  ketorolac (TORADOL) 30 MG/ML injection 30 mg (30 mg Intravenous Given 05/22/17 2036)  fentaNYL (SUBLIMAZE) injection 50 mcg (50 mcg Intravenous Given 05/22/17 2037)  cefTRIAXone (ROCEPHIN) 1 g in dextrose 5 % 50 mL IVPB (0 g Intravenous Stopped 05/22/17 2102)     Initial Impression / Assessment and Plan / ED Course  I have reviewed the triage vital signs and the nursing notes.  Pertinent labs & imaging results that were available during my care of the patient were reviewed by me and considered in my medical decision making (see chart for details).    PT w/ b/l flank pain and several days of dysuria, started on bactrim for UTI yesterday but back pain worsening. She was uncomfortable on exam but non-toxic w/ reassuring VS. B/l CVA tenderness noted without abdominal tenderness.    Her labs show normal Cr and WBC count. UA is c/w infection. I considered possibility of kidney stone but given b/l pain, I feel pyelonephritis is much more likely and do not feel she needs imaging currently. She is much improved after above medications including CTX and fluids. She feels comfortable managing at home. I gave course of keflex and extensively reviewed return precautions. She voiced understanding and was discharged in satisfactory condition.   Final Clinical Impressions(s) / ED Diagnoses   Final diagnoses:  Pyelonephritis    New Prescriptions Discharge Medication List as of 05/22/2017 10:52 PM    START taking these medications   Details  cephALEXin (KEFLEX) 500 MG capsule Take 1 capsule (500 mg total) by mouth 4 (four) times daily., Starting Wed 05/22/2017, Until Sat 06/01/2017, Print  oxyCODONE-acetaminophen (PERCOCET) 5-325 MG tablet Take 1 tablet by mouth every 6 (six) hours as needed., Starting Wed 05/22/2017, Print    promethazine (PHENERGAN) 25 MG tablet Take 1 tablet (25 mg total) by mouth every 6 (six) hours as needed for nausea or vomiting., Starting Wed 05/22/2017, Print         Augustine Leverette, Ambrose Finlandachel Morgan, MD 05/23/17 [phone]

## 2017-05-24 LAB — URINE CULTURE
CULTURE: NO GROWTH
Special Requests: NORMAL

## 2017-06-25 ENCOUNTER — Encounter (HOSPITAL_BASED_OUTPATIENT_CLINIC_OR_DEPARTMENT_OTHER): Payer: Self-pay | Admitting: Emergency Medicine

## 2017-06-25 ENCOUNTER — Emergency Department (HOSPITAL_BASED_OUTPATIENT_CLINIC_OR_DEPARTMENT_OTHER)
Admission: EM | Admit: 2017-06-25 | Discharge: 2017-06-25 | Disposition: A | Discharge status: Home / Self Care | Payer: Self-pay | Attending: Emergency Medicine | Admitting: Emergency Medicine

## 2017-06-25 ENCOUNTER — Emergency Department (HOSPITAL_BASED_OUTPATIENT_CLINIC_OR_DEPARTMENT_OTHER): Payer: Self-pay

## 2017-06-25 DIAGNOSIS — R0602 Shortness of breath: Secondary | ICD-10-CM | POA: Insufficient documentation

## 2017-06-25 DIAGNOSIS — K209 Esophagitis, unspecified without bleeding: Secondary | ICD-10-CM

## 2017-06-25 DIAGNOSIS — R911 Solitary pulmonary nodule: Secondary | ICD-10-CM | POA: Insufficient documentation

## 2017-06-25 DIAGNOSIS — R0789 Other chest pain: Secondary | ICD-10-CM | POA: Insufficient documentation

## 2017-06-25 LAB — CBC
HEMATOCRIT: 33.3 % — AB (ref 36.0–46.0)
HEMOGLOBIN: 11 g/dL — AB (ref 12.0–15.0)
MCH: 28.1 pg (ref 26.0–34.0)
MCHC: 33 g/dL (ref 30.0–36.0)
MCV: 84.9 fL (ref 78.0–100.0)
Platelets: 240 10*3/uL (ref 150–400)
RBC: 3.92 MIL/uL (ref 3.87–5.11)
RDW: 13.4 % (ref 11.5–15.5)
WBC: 6.3 10*3/uL (ref 4.0–10.5)

## 2017-06-25 LAB — BASIC METABOLIC PANEL
ANION GAP: 8 (ref 5–15)
BUN: 14 mg/dL (ref 6–20)
CO2: 25 mmol/L (ref 22–32)
Calcium: 8.8 mg/dL — ABNORMAL LOW (ref 8.9–10.3)
Chloride: 104 mmol/L (ref 101–111)
Creatinine, Ser: 0.68 mg/dL (ref 0.44–1.00)
Glucose, Bld: 116 mg/dL — ABNORMAL HIGH (ref 65–99)
POTASSIUM: 3.5 mmol/L (ref 3.5–5.1)
SODIUM: 137 mmol/L (ref 135–145)

## 2017-06-25 LAB — TROPONIN I

## 2017-06-25 MED ORDER — FAMOTIDINE 20 MG PO TABS: 20.0000 mg | ORAL_TABLET | Freq: Two times a day (BID) | ORAL | 0 refills | Status: DC

## 2017-06-25 MED ORDER — TRAMADOL HCL 50 MG PO TABS: 50.0000 mg | ORAL_TABLET | Freq: Four times a day (QID) | ORAL | 0 refills | Status: DC | PRN

## 2017-06-25 MED ORDER — IOPAMIDOL (ISOVUE-370) INJECTION 76%
100.0000 mL | Freq: Once | INTRAVENOUS | Status: AC | PRN
Start: 2017-06-25 — End: 2017-06-25
  Administered 2017-06-25: 100 mL via INTRAVENOUS

## 2017-06-25 MED FILL — traMADol HCL 50 MG TABS: 50 | 4 days supply | Qty: 15 | Fill #0

## 2017-06-25 MED FILL — FAMOTIDINE 20 MG TABLET: 20 | 15 days supply | Qty: 30 | Fill #0

## 2017-06-25 NOTE — ED Triage Notes (Signed)
Patient states that for the last 2 -3 weeks she has had intermittent chest pain and palpitations usually when she comes home from work. Patient did not go to work today and she has felt the right sided to central chest pressure and SOB with palpitations today

## 2017-06-25 NOTE — Discharge Instructions (Signed)
[facility]. Take the Pepcid for the next 2 weeks to help the irritation of the esophagus. Take the tramadol for the chest wall pain. Return for any new or worse symptoms. Will require follow-up CT scan for the nodules in the lung.

## 2017-06-25 NOTE — ED Provider Notes (Signed)
[facility]   CSN: [id number] Arrival date & time: 06/25/17  1215     History   Chief Complaint Chief Complaint  Patient presents with  . Chest Pain    HPI Jessica Fleming is a 51 y.o. female.  Patient with complaint of intermittent chest pain in the right side of the chest for the past 2 weeks associated with exertional shortness of breath. The chest pain is more common upon returning home from work. Right-sided made worse by movement of the right arm. No nausea no vomiting no fevers no abdominal pain. Pain does not radiate to the back.      Past Medical History:  Diagnosis Date  . GERD (gastroesophageal reflux disease)     Patient Active Problem List   Diagnosis Date Noted  . Left knee pain 09/20/2016  . 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 Morbid obesity due to excess calories (HCC) 10/25/2014  . Allergic rhinitis 04/12/2014  . B12 deficiency 04/12/2014  . Presbyopia 07/06/2013  . 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 Hypercholesterolemia 02/21/2013  . 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 No pertinent surgical history.  OB History    No data available       Home Medications    Prior to Admission medications   Medication Sig Start Date End Date Taking? Authorizing Provider  NAPROXEN PO Take by mouth.    [provider]  oxyCODONE-acetaminophen (PERCOCET) 5-325 MG tablet Take 1 tablet by mouth every 6 (six) hours as needed. 05/22/17   Little, Ambrose Finland, MD  promethazine (PHENERGAN) 25 MG tablet Take 1 tablet (25 mg total) by mouth every 6 (six) hours as needed for nausea or vomiting. 05/22/17   Little, Ambrose Finland, MD  sulfamethoxazole-trimethoprim (BACTRIM DS,SEPTRA DS) 800-160 MG tablet Take 1 tablet by mouth 2 (two) times daily.    [provider]  tiZANidine (ZANAFLEX) 4 MG capsule 1/2 - 1 tab by mouth three times a day as needed for muscle spasms    [provider]  traMADol (ULTRAM) 50 MG tablet Take 1 tablet (50 mg total) by mouth every 6 (six) hours as needed. 09/07/16   Charlynne Pander, MD    Family History History reviewed. No pertinent family history.  Social History Social History  Substance Use Topics  . Smoking status: Never Smoker  . Smokeless tobacco: Never Used  . Alcohol use No     Allergies   Ibuprofen; Fish oil; Hydrocodone-homatropine; and Prednisone   Review of Systems Review of Systems  Constitutional: Negative for fever.  HENT: Negative for congestion.   Eyes: Negative for redness.  Respiratory: Positive for shortness of breath.   Cardiovascular: Positive for chest pain.  Gastrointestinal: Negative for abdominal pain, nausea and vomiting.  Genitourinary: Negative for dysuria.  Musculoskeletal: Negative for back pain.  Skin: Negative for rash.  Neurological: Negative for headaches.  Hematological: Does not bruise/bleed easily.  Psychiatric/Behavioral: Negative for confusion.     Physical Exam Updated Vital Signs BP 111/63 (BP Location: Left Arm)   Pulse 65   Temp 98.2 F (36.8 C) (Oral)   Resp 16   Ht 1.549 m ( )   Wt 86.2 kg (190 lb)   LMP 06/04/2017   SpO2 98%   BMI 35.90 kg/m   Physical Exam  Constitutional: She is oriented to person, place,  and time. She appears well-developed and well-nourished. No distress.  HENT:  Head: Normocephalic and atraumatic.  Mouth/Throat: Oropharynx is clear and moist.  Eyes: Pupils are equal, round, and reactive to light. Conjunctivae and EOM are normal.  Neck: Normal range of motion. Neck supple.  Cardiovascular: Normal rate and regular rhythm.   Pulmonary/Chest: Effort normal and breath sounds normal. She exhibits tenderness.  Abdominal: Soft. Bowel sounds are normal. There is no tenderness.  Musculoskeletal: Normal range of  motion.  Neurological: She is alert and oriented to person, place, and time. No cranial nerve deficit or sensory deficit. She exhibits normal muscle tone. Coordination normal.  Skin: Skin is warm. No rash noted.  Nursing note and vitals reviewed.    ED Treatments / Results  Labs (all labs ordered are listed, but only abnormal results are displayed) Labs Reviewed  BASIC METABOLIC PANEL - Abnormal; Notable for the following:       Result Value   Glucose, Bld 116 (*)    Calcium 8.8 (*)    All other components within normal limits  CBC - Abnormal; Notable for the following:    Hemoglobin 11.0 (*)    HCT 33.3 (*)    All other components within normal limits  TROPONIN I    EKG  EKG Interpretation  Date/Time:  Tuesday June 25 2017 12:21:23 EDT Ventricular Rate:  74 PR Interval:    QRS Duration: 98 QT Interval:  430 QTC Calculation: 478 R Axis:   33 Text Interpretation:  Sinus rhythm Low voltage, precordial leads Borderline T abnormalities, anterior leads Baseline wander in lead(s) V3 V4 V5 V6 No significant change since last tracing Reconfirmed by Vanetta Mulders [phone]) on 06/25/2017 12:25:44 PM       Radiology Dg Chest 2 View  Result Date: 06/25/2017 CLINICAL DATA:  Right-sided chest pain, palpitations, shortness of breath for 2 weeks EXAM: CHEST  2 VIEW COMPARISON:  08/18/2016 FINDINGS: The heart size and mediastinal contours are within normal limits. Both lungs are clear. The visualized skeletal structures are unremarkable. IMPRESSION: No active cardiopulmonary disease. Electronically Signed   By: Elige Ko   On: 06/25/2017 13:02    Procedures Procedures (including critical care time)  Medications Ordered in ED Medications  iopamidol (ISOVUE-370) 76 % injection 100 mL (100 mLs Intravenous Contrast Given 06/25/17 1403)     Initial Impression / Assessment and Plan / ED Course  I have reviewed the triage vital signs and the nursing notes.  Pertinent labs &  imaging results that were available during my care of the patient were reviewed by me and considered in my medical decision making (see chart for details).     Patient's chest pain has been present for several hours each day over the past 2 weeks. EKG without acute changes troponin negative not likely to be an acute cardiac event. Was a degree of shortness of breath so CT angiogram was done shows no evidence of pulmonary embolus to show some evidence of distal esophageal inflammation. Also found some pulmonary nodules and possibly an enlarged lymph node in the abdomen. Patient [facility] community [facility]. Patient CT and lab results provided to her. Will require follow-up CT to follow the pulmonary nodules.  Do not feel it is an acute cardiac event. We'll treat as if it could be chest wall pain with tramadol. And for the esophagitis will treat with a two-week course of Pepcid. Patient will return for any new or worse symptoms.  Final Clinical Impressions(s) /  ED Diagnoses   Final diagnoses:  Chest wall pain    New Prescriptions New Prescriptions   No medications on file     Vanetta Mulders, MD 06/25/17 1502

## 2017-07-02 ENCOUNTER — Encounter (HOSPITAL_BASED_OUTPATIENT_CLINIC_OR_DEPARTMENT_OTHER): Payer: Self-pay

## 2017-07-02 ENCOUNTER — Emergency Department (HOSPITAL_BASED_OUTPATIENT_CLINIC_OR_DEPARTMENT_OTHER)
Admission: EM | Admit: 2017-07-02 | Discharge: 2017-07-03 | Disposition: A | Discharge status: Home / Self Care | Payer: Self-pay | Attending: Emergency Medicine | Admitting: Emergency Medicine

## 2017-07-02 DIAGNOSIS — Z79899 Other long term (current) drug therapy: Secondary | ICD-10-CM | POA: Insufficient documentation

## 2017-07-02 DIAGNOSIS — L282 Other prurigo: Secondary | ICD-10-CM | POA: Insufficient documentation

## 2017-07-02 NOTE — ED Triage Notes (Signed)
Pt c/o scattered rash that started this morning, tried  of benadryl around 2000 without relief

## 2017-07-03 MED ORDER — HYDROXYZINE HCL 25 MG PO TABS
25.0000 mg | ORAL_TABLET | Freq: Once | ORAL | Status: AC
  Administered 2017-07-03: 25 mg via ORAL
  Filled 2017-07-03: qty 1

## 2017-07-03 MED ORDER — HYDROXYZINE HCL 25 MG PO TABS: 25.0000 mg | ORAL_TABLET | Freq: Four times a day (QID) | ORAL | 0 refills | Status: DC

## 2017-07-03 MED ORDER — DEXAMETHASONE SODIUM PHOSPHATE 10 MG/ML IJ SOLN
10.0000 mg | Freq: Once | INTRAMUSCULAR | Status: AC
  Administered 2017-07-03: 10 mg via INTRAMUSCULAR
  Filled 2017-07-03: qty 1

## 2017-07-03 NOTE — Discharge Instructions (Signed)
Continue with the pepcid, 20 mg two times daily. You received a prescription for two weeks worth of this medication during your visit on 06/25/17. Add the atarax. See the attached care instructions for additional treatment.

## 2017-07-03 NOTE — ED Provider Notes (Signed)
[facility] EMERGENCY DEPARTMENT Provider Note   CSN: [id number] Arrival date & time: 07/02/17  2333     History   Chief Complaint Chief Complaint  Patient presents with  . Rash    HPI Jessica Fleming is a 51 y.o. female.  The history is provided by the patient and a relative. No language interpreter was used.  Rash   This is a new problem. The current episode started 12 to 24 hours ago. The problem has been gradually worsening. The problem is associated with an unknown factor. The rash is present on the face, torso and back. Associated symptoms include itching. She has tried antihistamines for the symptoms. The treatment provided no relief.    Past Medical History:  Diagnosis Date  . GERD (gastroesophageal reflux disease)     Patient Active Problem List   Diagnosis Date Noted  . Left knee pain 09/20/2016  . 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 Morbid obesity due to excess calories (HCC) 10/25/2014  . Allergic rhinitis 04/12/2014  . B12 deficiency 04/12/2014  . Presbyopia 07/06/2013  . 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 Hypercholesterolemia 02/21/2013  . 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 No pertinent surgical history.  OB History    No data available       Home Medications    Prior to Admission medications   Medication Sig Start Date End Date Taking? Authorizing Provider  famotidine (PEPCID) 20 MG tablet Take 1 tablet (20 mg total) by mouth 2 (two) times daily. 06/25/17   Vanetta Mulders, MD  NAPROXEN PO Take by mouth.    [provider]  oxyCODONE-acetaminophen (PERCOCET) 5-325 MG tablet Take 1 tablet by mouth every 6 (six) hours as needed. 05/22/17   Little, Ambrose Finland, MD  promethazine (PHENERGAN) 25 MG tablet Take 1 tablet (25 mg total) by mouth every 6 (six) hours  as needed for nausea or vomiting. 05/22/17   Little, Ambrose Finland, MD  sulfamethoxazole-trimethoprim (BACTRIM DS,SEPTRA DS) 800-160 MG tablet Take 1 tablet by mouth 2 (two) times daily.    [provider]  tiZANidine (ZANAFLEX) 4 MG capsule 1/2 - 1 tab by mouth three times a day as needed for muscle spasms    [provider]  traMADol (ULTRAM) 50 MG tablet Take 1 tablet (50 mg total) by mouth every 6 (six) hours as needed. 09/07/16   Charlynne Pander, MD  traMADol (ULTRAM) 50 MG tablet Take 1 tablet (50 mg total) by mouth every 6 (six) hours as needed. 06/25/17   Vanetta Mulders, MD    Family History No family history on file.  Social History Social History  Substance Use Topics  . Smoking status: Never Smoker  . Smokeless tobacco: Never Used  . Alcohol use No     Allergies   Ibuprofen; Fish oil; Hydrocodone-homatropine; and Prednisone   Review of Systems Review of Systems  Skin: Positive for itching and rash.  All other systems reviewed and are negative.    Physical Exam Updated Vital Signs BP 118/75 (BP Location: Right Arm)   Pulse 66   Temp 98 F (36.7 C) (Oral)   Resp 16   Ht 5\' 1"  (1.549 m)   Wt 81.6 kg (180 lb)   LMP 06/04/2017   SpO2 99%   BMI 34.01 kg/m   Physical Exam  Constitutional: She appears well-developed  and well-nourished. No distress.  HENT:  Head: Normocephalic and atraumatic.  Eyes: Conjunctivae are normal.  Neck: Neck supple.  Cardiovascular: Normal rate and regular rhythm.   No murmur heard. Pulmonary/Chest: Effort normal and breath sounds normal. No respiratory distress.  Abdominal: Soft. There is no tenderness.  Musculoskeletal: She exhibits no edema.  Neurological: She is alert.  Skin: Skin is warm and dry. Rash noted.  Psychiatric: She has a normal mood and affect.  Nursing note and vitals reviewed.    ED Treatments / Results  Labs (all labs ordered are listed, but only abnormal results are displayed) Labs  Reviewed - No data to display  EKG  EKG Interpretation None       Radiology No results found.  Procedures Procedures (including critical care time)  Medications Ordered in ED Medications - No data to display   Initial Impression / Assessment and Plan / ED Course  I have reviewed the triage vital signs and the nursing notes.  Pertinent labs & imaging results that were available during my care of the patient were reviewed by me and considered in my medical decision making (see chart for details).     Patient with nonspecific eruption. No signs of infection. Discharge with symptomatic treatment. Follow up with PCP in 2-3 days.    Pruritic rash involving face, torso, back. No known new exposures. Given decadron and atarax in ED. Will discharge with pepcid and atarax. Care instructions and return precautions provided.     Final Clinical Impressions(s) / ED Diagnoses   Final diagnoses:  Pruritic rash    New Prescriptions New Prescriptions   HYDROXYZINE (ATARAX/VISTARIL) 25 MG TABLET    Take 1 tablet (25 mg total) by mouth every 6 (six) hours.     Felicie MornSmith, Dara Camargo, NP 07/03/17 [id number]    Geoffery Lyonselo, [place], MD 07/03/17 [phone]

## 2017-07-03 NOTE — ED Notes (Signed)
ED Provider at bedside. 

## 2017-08-19 ENCOUNTER — Other Ambulatory Visit: Payer: Self-pay

## 2017-08-19 ENCOUNTER — Encounter (HOSPITAL_BASED_OUTPATIENT_CLINIC_OR_DEPARTMENT_OTHER): Payer: Self-pay | Admitting: Emergency Medicine

## 2017-08-19 ENCOUNTER — Emergency Department (HOSPITAL_BASED_OUTPATIENT_CLINIC_OR_DEPARTMENT_OTHER)
Admission: EM | Admit: 2017-08-19 | Discharge: 2017-08-19 | Disposition: A | Discharge status: Home / Self Care | Payer: Medicaid Other | Attending: Emergency Medicine | Admitting: Emergency Medicine

## 2017-08-19 DIAGNOSIS — Z79899 Other long term (current) drug therapy: Secondary | ICD-10-CM | POA: Insufficient documentation

## 2017-08-19 DIAGNOSIS — R42 Dizziness and giddiness: Secondary | ICD-10-CM | POA: Insufficient documentation

## 2017-08-19 LAB — BASIC METABOLIC PANEL
Anion gap: 4 — ABNORMAL LOW (ref 5–15)
BUN: 13 mg/dL (ref 6–20)
CHLORIDE: 106 mmol/L (ref 101–111)
CO2: 26 mmol/L (ref 22–32)
CREATININE: 0.53 mg/dL (ref 0.44–1.00)
Calcium: 8.9 mg/dL (ref 8.9–10.3)
GFR calc Af Amer: >60 mL/min (ref >60)
GFR calc non Af Amer: >60 mL/min (ref >60)
Glucose, Bld: 111 mg/dL — ABNORMAL HIGH (ref 65–99)
Potassium: 3.7 mmol/L (ref 3.5–5.1)
Sodium: 136 mmol/L (ref 135–145)

## 2017-08-19 LAB — CBC WITH DIFFERENTIAL/PLATELET
Basophils Absolute: 0 10*3/uL (ref 0.0–0.1)
Basophils Relative: 0 %
Eosinophils Absolute: 0.2 10*3/uL (ref 0.0–0.7)
Eosinophils Relative: 3 %
HEMATOCRIT: 32.9 % — AB (ref 36.0–46.0)
HEMOGLOBIN: 10.9 g/dL — AB (ref 12.0–15.0)
LYMPHS ABS: 1.2 10*3/uL (ref 0.7–4.0)
Lymphocytes Relative: 17 %
MCH: 28 pg (ref 26.0–34.0)
MCHC: 33.1 g/dL (ref 30.0–36.0)
MCV: 84.6 fL (ref 78.0–100.0)
MONOS PCT: 8 %
Monocytes Absolute: 0.6 10*3/uL (ref 0.1–1.0)
NEUTROS ABS: 5.1 10*3/uL (ref 1.7–7.7)
NEUTROS PCT: 72 %
Platelets: 239 10*3/uL (ref 150–400)
RBC: 3.89 MIL/uL (ref 3.87–5.11)
RDW: 12.9 % (ref 11.5–15.5)
WBC: 7.1 10*3/uL (ref 4.0–10.5)

## 2017-08-19 MED ORDER — PROMETHAZINE HCL 25 MG PO TABS: 25.0000 mg | ORAL_TABLET | Freq: Four times a day (QID) | ORAL | 0 refills | Status: DC | PRN

## 2017-08-19 MED ORDER — MECLIZINE HCL 25 MG PO TABS
50.0000 mg | ORAL_TABLET | Freq: Once | ORAL | Status: AC
  Administered 2017-08-19: 50 mg via ORAL
  Filled 2017-08-19: qty 2

## 2017-08-19 MED ORDER — MECLIZINE HCL 25 MG PO TABS: 25.0000 mg | ORAL_TABLET | Freq: Three times a day (TID) | ORAL | 0 refills | Status: DC | PRN

## 2017-08-19 MED ORDER — PROMETHAZINE HCL 25 MG PO TABS
25.0000 mg | ORAL_TABLET | Freq: Once | ORAL | Status: AC
  Administered 2017-08-19: 25 mg via ORAL
  Filled 2017-08-19: qty 1

## 2017-08-19 MED FILL — MECLIZINE 25 MG TABLET: 25 | 10 days supply | Qty: 30 | Fill #0

## 2017-08-19 MED FILL — PROMETHAZINE 25 MG TABLET: 25 | 7 days supply | Qty: 30 | Fill #0

## 2017-08-19 NOTE — Discharge Instructions (Signed)
Your symptoms today are consistent with vertigo Take medications only when you feel dizzy or nauseated. Return for worsening symptoms, including confusion, intractable vomiting, new vision or speech changes, numbness or weakness of the arm or leg, inability to walk, or any other symptoms concerning to you.

## 2017-08-19 NOTE — ED Triage Notes (Signed)
States was fine when went to bed  Awoke with dizziness ans nausea

## 2017-08-19 NOTE — ED Provider Notes (Signed)
[facility] EMERGENCY DEPARTMENT Provider Note   CSN: [id number] Arrival date & time: 08/19/17  [id number]     History   Chief Complaint Chief Complaint  Patient presents with  . Dizziness    HPI Jessica Fleming is a 51 y.o. female.  The history is provided by the patient.  Dizziness  Quality:  Head spinning Severity:  Moderate Onset quality:  Sudden Duration:  2 hours Timing:  Constant Progression:  Unchanged Chronicity:  New Context comment:  Waking up from sleep Relieved by:  Being still Worsened by:  Standing up and movement Ineffective treatments:  None tried Associated symptoms: nausea and vomiting   Associated symptoms: no chest pain, no diarrhea, no headaches, no shortness of breath, no syncope, no vision changes and no weakness    51 year old female who presents with sudden onset of dizziness after waking up this morning.  States that she has had similar symptoms in the past, but have self resolved.  Reports that the room appears to be spinning or moving, worse with any movement or when trying to walk.  Associated with multiple episodes of nausea and vomiting.  Denies recent fever or URI symptoms, confusion, headaches, vision or speech changes, focal numbness or weakness, chest pain or difficulty breathing, syncope or near syncope. Past Medical History:  Diagnosis Date  . GERD (gastroesophageal reflux disease)     Patient Active Problem List   Diagnosis Date Noted  . Left knee pain 09/20/2016  . 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 Morbid obesity due to excess calories (HCC) 10/25/2014  . Allergic rhinitis 04/12/2014  . B12 deficiency 04/12/2014  . Presbyopia 07/06/2013  . 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 Hypercholesterolemia 02/21/2013  . 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 No  pertinent surgical history.  OB History    No data available       Home Medications    Prior to Admission medications   Medication Sig Start Date End Date Taking? Authorizing Provider  meclizine (ANTIVERT) 25 MG tablet Take 1 tablet (25 mg total) by mouth 3 (three) times daily as needed for dizziness. 08/19/17   Lavera GuiseLiu, Lulu Hirschmann Duo, MD  NAPROXEN PO Take by mouth.    [provider]  oxyCODONE-acetaminophen (PERCOCET) 5-325 MG tablet Take 1 tablet by mouth every 6 (six) hours as needed. 05/22/17   Little, Ambrose Finlandachel Morgan, MD  promethazine (PHENERGAN) 25 MG tablet Take 1 tablet (25 mg total) by mouth every 6 (six) hours as needed for nausea or vomiting. 05/22/17   Little, Ambrose Finlandachel Morgan, MD  promethazine (PHENERGAN) 25 MG tablet Take 1 tablet (25 mg total) by mouth every 6 (six) hours as needed for nausea or vomiting. 08/19/17   Lavera GuiseLiu, Hillis Mcphatter Duo, MD  tiZANidine (ZANAFLEX) 4 MG capsule 1/2 - 1 tab by mouth three times a day as needed for muscle spasms    [provider]  traMADol (ULTRAM) 50 MG tablet Take 1 tablet (50 mg total) by mouth every 6 (six) hours as needed. 09/07/16   Charlynne PanderYao, David Hsienta, MD  traMADol (ULTRAM) 50 MG tablet Take 1 tablet (50 mg total) by mouth every 6 (six) hours as needed. 06/25/17   Vanetta MuldersZackowski, Scott, MD    Family History No family history on file.  Social History Social History   Tobacco Use  . Smoking status: Never Smoker  . Smokeless  tobacco: Never Used  Substance Use Topics  . Alcohol use: No  . Drug use: No     Allergies   Ibuprofen; Fish oil; Hydrocodone-homatropine; and Prednisone   Review of Systems Review of Systems  Constitutional: Negative for fever.  Respiratory: Negative for shortness of breath.   Cardiovascular: Negative for chest pain and syncope.  Gastrointestinal: Positive for nausea and vomiting. Negative for diarrhea.  Neurological: Positive for dizziness. Negative for weakness and headaches.  All other systems reviewed and are  negative.    Physical Exam Updated Vital Signs BP 136/75 (BP Location: Left Arm)   Pulse 65   Temp 97.8 F (36.6 C) (Oral)   Resp 16   Ht 5\' 2"  (1.575 m)   Wt 90.7 kg (200 lb)   SpO2 98%   BMI 36.58 kg/m   Physical Exam Physical Exam  Nursing note and vitals reviewed. Constitutional: Well developed, well nourished, non-toxic, and in no acute distress Head: Normocephalic and atraumatic.  Mouth/Throat: Oropharynx is clear and moist.  Neck: Normal range of motion. Neck supple.  Cardiovascular: Normal rate and regular rhythm.   Pulmonary/Chest: Effort normal and breath sounds normal.  Abdominal: Soft. There is no tenderness. There is no rebound and no guarding.  Musculoskeletal: Normal range of motion.  Skin: Skin is warm and dry.  Psychiatric: Cooperative Neurological:  Alert, oriented to person, place, time, and situation. Memory grossly in tact. Fluent speech. No dysarthria or aphasia.  Cranial nerves: VF are full.  Pupils are symmetric, and reactive to light. EOMI without nystagmus. No gaze deviation. Facial muscles symmetric with activation. Sensation to light touch over face in tact bilaterally. Hearing grossly in tact. Palate elevates symmetrically. Head turn and shoulder shrug are intact. Tongue midline.  Reflexes defered.  Muscle bulk and tone normal. No pronator drift. Moves all extremities symmetrically. Sensation to light touch is in tact throughout in bilateral upper and lower extremities. Coordination reveals no dysmetria with finger to nose. Gait is narrow-based and steady. Non-ataxic.    ED Treatments / Results  Labs (all labs ordered are listed, but only abnormal results are displayed) Labs Reviewed  CBC WITH DIFFERENTIAL/PLATELET - Abnormal; Notable for the following components:      Result Value   Hemoglobin 10.9 (*)    HCT 32.9 (*)    All other components within normal limits  BASIC METABOLIC PANEL - Abnormal; Notable for the following components:    Glucose, Bld 111 (*)    Anion gap 4 (*)    All other components within normal limits    EKG  EKG Interpretation  Date/Time:  Monday August 19 2017 08:25:13 EST Ventricular Rate:  61 PR Interval:    QRS Duration: 105 QT Interval:  449 QTC Calculation: 453 R Axis:   15 Text Interpretation:  Sinus rhythm Low voltage, precordial leads No acute changes Confirmed by Crista Curb [phone]) on 08/19/2017 8:47:01 AM       Radiology No results found.  Procedures Procedures (including critical care time)  Medications Ordered in ED Medications  meclizine (ANTIVERT) tablet 50 mg (50 mg Oral Given 08/19/17 0815)  promethazine (PHENERGAN) tablet 25 mg (25 mg Oral Given 08/19/17 0816)     Initial Impression / Assessment and Plan / ED Course  I have reviewed the triage vital signs and the nursing notes.  Pertinent labs & imaging results that were available during my care of the patient were reviewed by me and considered in my medical decision making (see chart for details).  51 year old female who presents with dizziness.  By history presentation is most consistent with vertigo not syncope or near syncope.  She has an intact neurological exam.  She has no central features.  I doubt a cerebellar process.  Suspect peripheral vertigo and given meclizine and Phenergan.  Patient symptoms improved.  She ambulates steadily here.  Will discharge home with symptomatic management. Strict return and follow-up instructions reviewed. She expressed understanding of all discharge instructions and felt comfortable with the plan of care.   Final Clinical Impressions(s) / ED Diagnoses   Final diagnoses:  Dizziness  Vertigo    ED Discharge Orders        Ordered    promethazine (PHENERGAN) 25 MG tablet  Every 6 hours PRN     08/19/17 0944    meclizine (ANTIVERT) 25 MG tablet  3 times daily PRN     08/19/17 0944       Lavera GuiseLiu, Nawaal Alling Duo, MD 08/19/17 [phone]

## 2017-11-19 ENCOUNTER — Other Ambulatory Visit: Payer: Self-pay

## 2017-11-19 ENCOUNTER — Emergency Department (HOSPITAL_BASED_OUTPATIENT_CLINIC_OR_DEPARTMENT_OTHER): Payer: Medicaid Other

## 2017-11-19 ENCOUNTER — Emergency Department (HOSPITAL_BASED_OUTPATIENT_CLINIC_OR_DEPARTMENT_OTHER)
Admission: EM | Admit: 2017-11-19 | Discharge: 2017-11-19 | Disposition: A | Discharge status: Home / Self Care | Payer: Medicaid Other | Attending: Emergency Medicine | Admitting: Emergency Medicine

## 2017-11-19 ENCOUNTER — Encounter (HOSPITAL_BASED_OUTPATIENT_CLINIC_OR_DEPARTMENT_OTHER): Payer: Self-pay

## 2017-11-19 DIAGNOSIS — R1084 Generalized abdominal pain: Secondary | ICD-10-CM | POA: Insufficient documentation

## 2017-11-19 DIAGNOSIS — E7800 Pure hypercholesterolemia, unspecified: Secondary | ICD-10-CM | POA: Insufficient documentation

## 2017-11-19 DIAGNOSIS — B9789 Other viral agents as the cause of diseases classified elsewhere: Secondary | ICD-10-CM

## 2017-11-19 DIAGNOSIS — R05 Cough: Secondary | ICD-10-CM | POA: Insufficient documentation

## 2017-11-19 DIAGNOSIS — B349 Viral infection, unspecified: Secondary | ICD-10-CM | POA: Insufficient documentation

## 2017-11-19 DIAGNOSIS — R109 Unspecified abdominal pain: Secondary | ICD-10-CM

## 2017-11-19 DIAGNOSIS — J069 Acute upper respiratory infection, unspecified: Secondary | ICD-10-CM | POA: Insufficient documentation

## 2017-11-19 LAB — CBC WITH DIFFERENTIAL/PLATELET
BASOS ABS: 0 10*3/uL (ref 0.0–0.1)
Basophils Relative: 0 %
EOS ABS: 0.2 10*3/uL (ref 0.0–0.7)
EOS PCT: 3 %
HCT: 34.2 % — ABNORMAL LOW (ref 36.0–46.0)
Hemoglobin: 11.2 g/dL — ABNORMAL LOW (ref 12.0–15.0)
LYMPHS ABS: 1.7 10*3/uL (ref 0.7–4.0)
Lymphocytes Relative: 25 %
MCH: 27.5 pg (ref 26.0–34.0)
MCHC: 32.7 g/dL (ref 30.0–36.0)
MCV: 84 fL (ref 78.0–100.0)
MONO ABS: 0.6 10*3/uL (ref 0.1–1.0)
Monocytes Relative: 9 %
Neutro Abs: 4.2 10*3/uL (ref 1.7–7.7)
Neutrophils Relative %: 63 %
PLATELETS: 249 10*3/uL (ref 150–400)
RBC: 4.07 MIL/uL (ref 3.87–5.11)
RDW: 13.1 % (ref 11.5–15.5)
WBC: 6.7 10*3/uL (ref 4.0–10.5)

## 2017-11-19 LAB — PREGNANCY, URINE: Preg Test, Ur: NEGATIVE

## 2017-11-19 LAB — BASIC METABOLIC PANEL
Anion gap: 7 (ref 5–15)
BUN: 17 mg/dL (ref 6–20)
CALCIUM: 8.9 mg/dL (ref 8.9–10.3)
CO2: 28 mmol/L (ref 22–32)
CREATININE: 0.65 mg/dL (ref 0.44–1.00)
Chloride: 102 mmol/L (ref 101–111)
GFR calc Af Amer: >60 mL/min (ref >60)
Glucose, Bld: 110 mg/dL — ABNORMAL HIGH (ref 65–99)
POTASSIUM: 3.9 mmol/L (ref 3.5–5.1)
SODIUM: 137 mmol/L (ref 135–145)

## 2017-11-19 LAB — URINALYSIS, ROUTINE W REFLEX MICROSCOPIC
Bilirubin Urine: NEGATIVE
Glucose, UA: NEGATIVE mg/dL
Ketones, ur: NEGATIVE mg/dL
Leukocytes, UA: NEGATIVE
Nitrite: NEGATIVE
Protein, ur: NEGATIVE mg/dL
Specific Gravity, Urine: 1.015 (ref 1.005–1.030)
pH: 6 (ref 5.0–8.0)

## 2017-11-19 LAB — URINALYSIS, MICROSCOPIC (REFLEX)

## 2017-11-19 MED ORDER — CEPHALEXIN 500 MG PO CAPS: 500.0000 mg | ORAL_CAPSULE | Freq: Four times a day (QID) | ORAL | 0 refills | Status: DC

## 2017-11-19 MED ORDER — OXYCODONE-ACETAMINOPHEN 5-325 MG PO TABS
1.0000 | ORAL_TABLET | Freq: Once | ORAL | Status: AC
  Administered 2017-11-19: 1 via ORAL
  Filled 2017-11-19: qty 1

## 2017-11-19 MED ORDER — MORPHINE SULFATE (PF) 4 MG/ML IV SOLN
4.0000 mg | Freq: Once | INTRAVENOUS | Status: AC
Start: 2017-11-19 — End: 2017-11-19
  Administered 2017-11-19: 4 mg via INTRAVENOUS
  Filled 2017-11-19: qty 1

## 2017-11-19 MED ORDER — ONDANSETRON HCL 4 MG/2ML IJ SOLN
4.0000 mg | Freq: Once | INTRAMUSCULAR | Status: AC
  Administered 2017-11-19: 4 mg via INTRAVENOUS
  Filled 2017-11-19: qty 2

## 2017-11-19 MED FILL — CEPHALEXIN 500 MG CAPSULE: 500 | 5 days supply | Qty: 20 | Fill #0

## 2017-11-19 NOTE — Discharge Instructions (Signed)
As we discussed work up today was mostly normal. There is a small amount of blood noted in your urine. Sometimes this can be seen with a recently passed kidney stone or early urinary tract infection. There is a very small chance that the pain is from shingles rash, usually if this is shingles the rash begins within 1 week.   Take 1000 mg tylenol every 6 hours. Take tessalon perles for cough. Stay well hydrated.   You have a prescription for antibiotic for possible early urinary tract infection that is not yet showing up. Wait two days, if pain does not improve or if you develop burning with urination, increase urinary frequency or urgency start taking your antibiotics.   Follow up with your primary care provider in 2-3 days if pain persists.

## 2017-11-19 NOTE — ED Triage Notes (Signed)
C/o left flank x 4 days-NAD-steady gait

## 2017-11-19 NOTE — ED Provider Notes (Signed)
[facility] EMERGENCY DEPARTMENT Provider Note   CSN: [id number] Arrival date & time: 11/19/17  1117     History   Chief Complaint Chief Complaint  Patient presents with  . Flank Pain    HPI Jessica Fleming is a 52 y.o. female ith history of GERD,abdominal mass followed by GI and general surgery, pyelonephritis is here for evaluation of left flank pain for 4 days. Pain is described as constant and sharp, nonradiating. Has taken ibuprofen without relief. Artery factors. Associated symptoms include mild nausea, generalized malaise and dry cough also for 4 days. Denies chest pain shortness of breath, abdominal pain, vomiting, changes in bowel movements, dysuria, hematuria.States the pain is similar to the one time she had a kidney infection. She denies history of kidney stones or abdominal surgeries.  Thinks she is going through menopause, menstrual cycles are irregular last one was in January 2019.  No abnormal vaginal discharge or bleeding.   HPI  Past Medical History:  Diagnosis Date  . GERD (gastroesophageal reflux disease)     Patient Active Problem List   Diagnosis Date Noted  . Left knee pain 09/20/2016  . 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 Morbid obesity due to excess calories (HCC) 10/25/2014  . Allergic rhinitis 04/12/2014  . B12 deficiency 04/12/2014  . Presbyopia 07/06/2013  . 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 Hypercholesterolemia 02/21/2013  . 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 No pertinent surgical history.  OB History    No data available       Home Medications    Prior to Admission medications   Medication Sig Start Date End Date Taking? Authorizing Provider  cephALEXin (KEFLEX) 500 MG capsule Take 1 capsule (500 mg total) by mouth 4 (four) times daily. 11/19/17   Liberty Handy, PA-C  meclizine (ANTIVERT) 25 MG tablet Take 1 tablet (25 mg total) by mouth 3 (three) times daily as needed for dizziness. 08/19/17   Lavera Guise, MD  NAPROXEN PO Take by mouth.    [provider]  oxyCODONE-acetaminophen (PERCOCET) 5-325 MG tablet Take 1 tablet by mouth every 6 (six) hours as needed. 05/22/17   Little, Ambrose Finland, MD  promethazine (PHENERGAN) 25 MG tablet Take 1 tablet (25 mg total) by mouth every 6 (six) hours as needed for nausea or vomiting. 05/22/17   Little, Ambrose Finland, MD  promethazine (PHENERGAN) 25 MG tablet Take 1 tablet (25 mg total) by mouth every 6 (six) hours as needed for nausea or vomiting. 08/19/17   Lavera Guise, MD  tiZANidine (ZANAFLEX) 4 MG capsule 1/2 - 1 tab by mouth three times a day as needed for muscle spasms    [provider]  traMADol (ULTRAM) 50 MG tablet Take 1 tablet (50 mg total) by mouth every 6 (six) hours as needed. 09/07/16   Charlynne Pander, MD  traMADol (ULTRAM) 50 MG tablet Take 1 tablet (50 mg total) by mouth every 6 (six) hours as needed. 06/25/17   Vanetta Mulders, MD    Family History No family history on file.  Social History Social History   Tobacco Use  . Smoking status: Never Smoker  . Smokeless tobacco: Never Used  Substance Use Topics  . Alcohol use: No  . Drug use: No     Allergies   Fish oil; Hydrocodone-homatropine; and Prednisone  Review of Systems Review of Systems  Constitutional: Positive for chills and fatigue.  Respiratory: Positive for cough.   Gastrointestinal: Positive for nausea.  Genitourinary: Positive for flank pain.  All other systems reviewed and are negative.    Physical Exam Updated Vital Signs BP 123/61 (BP Location: Left Arm)   Pulse 66   Temp 97.6 F (36.4 C) (Oral)   Resp 16   Ht 5\' 2"  (1.575 m)   Wt 96.6 kg (212 lb 15.4 oz)   LMP 10/01/2017   SpO2 99%   BMI 38.95 kg/m   Physical Exam  Constitutional: She is oriented to person, place,  and time. She appears well-developed and well-nourished. No distress.  Non toxic  HENT:  Head: Normocephalic and atraumatic.  Nose: Nose normal.  Mouth/Throat: No oropharyngeal exudate.  Moist mucous membranes   Eyes: Conjunctivae and EOM are normal. Pupils are equal, round, and reactive to light.  Neck: Normal range of motion.  Cardiovascular: Normal rate, regular rhythm and intact distal pulses.  No murmur heard. 2+ DP and radial pulses bilaterally. No LE edema.   Pulmonary/Chest: Effort normal and breath sounds normal. No respiratory distress. She has no wheezes. She has no rales.  Question decreased breath sounds to lower lobes although difficult exam due to body habitus   Abdominal: Soft. Bowel sounds are normal. There is tenderness.  +Left CVAT. TTP to left flank and up to left lateral rib cage. No G/R/R. No suprapubic tenderness.   Musculoskeletal: Normal range of motion. She exhibits no deformity.  No midline TL spine tenderness or step offs. Negative SLR bilaterally.   Neurological: She is alert and oriented to person, place, and time.  Skin: Skin is warm and dry. Capillary refill takes less than 2 seconds.  No rash or skin changes to back/flank   Psychiatric: She has a normal mood and affect. Her behavior is normal. Judgment and thought content normal.  Nursing note and vitals reviewed.    ED Treatments / Results  Labs (all labs ordered are listed, but only abnormal results are displayed) Labs Reviewed  URINALYSIS, ROUTINE W REFLEX MICROSCOPIC - Abnormal; Notable for the following components:      Result Value   Hgb urine dipstick LARGE (*)    All other components within normal limits  CBC WITH DIFFERENTIAL/PLATELET - Abnormal; Notable for the following components:   Hemoglobin 11.2 (*)    HCT 34.2 (*)    All other components within normal limits  BASIC METABOLIC PANEL - Abnormal; Notable for the following components:   Glucose, Bld 110 (*)    All other components  within normal limits  URINALYSIS, MICROSCOPIC (REFLEX) - Abnormal; Notable for the following components:   Bacteria, UA RARE (*)    Squamous Epithelial / LPF 0-5 (*)    All other components within normal limits  URINE CULTURE  PREGNANCY, URINE    EKG  EKG Interpretation None       Radiology Dg Chest 2 View  Result Date: 11/19/2017 CLINICAL DATA:  Left flank pain. EXAM: CHEST  2 VIEW COMPARISON:  Radiographs of June 25, 2017. FINDINGS: The heart size and mediastinal contours are within normal limits. Both lungs are clear. No pneumothorax or pleural effusion is noted. The visualized skeletal structures are unremarkable. IMPRESSION: No active cardiopulmonary disease. Electronically Signed   By: Lupita Raider, M.D.   On: 11/19/2017 12:18   Ct Renal Stone Study  Result Date: 11/19/2017 CLINICAL DATA:  Left flank pain over the last  4 days. EXAM: CT ABDOMEN AND PELVIS WITHOUT CONTRAST TECHNIQUE: Multidetector CT imaging of the abdomen and pelvis was performed following the standard protocol without IV contrast. COMPARISON:  07/26/2017 FINDINGS: Lower chest: Normal Hepatobiliary: No calcified gallstones. No hepatic parenchymal lesion. Pancreas: Pancreas itself appears normal. Just superior to the mid body of the stomach, again demonstrated is a well-circumscribed ovoid abnormality measuring 3.7 x 3.0 by 3.4 cm with Hounsfield unit measurement of 35. This measures slightly smaller than on the previous study with slightly more dense. 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 Differential diagnosis does include pseudo cyst in gastric diverticulum but those are felt less likely. Spleen: Normal Adrenals/Urinary Tract: Adrenal glands are normal. Kidneys are normal. No cyst, mass, stone or hydronephrosis. Bladder is normal. Stomach/Bowel: Mild diverticulosis of the colon but no CT evidence of diverticulitis. Vascular/Lymphatic: Minimal aortic atherosclerosis. IVC is normal. No  retroperitoneal adenopathy. Reproductive: Normal Other: No free fluid or air. 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 This actually measures smaller than on the previous study of November 2018, though the density is higher. This could be due to some internal hemorrhage. Conceivably, this could relate to symptoms. Electronically Signed   By: Paulina Fusi M.D.   On: 11/19/2017 13:19    Procedures Procedures (including critical care time)  Medications Ordered in ED Medications  ondansetron (ZOFRAN) injection 4 mg (4 mg Intravenous Given 11/19/17 1221)  morphine 4 MG/ML injection 4 mg (4 mg Intravenous Given 11/19/17 1226)  oxyCODONE-acetaminophen (PERCOCET/ROXICET) 5-325 MG per tablet 1 tablet (1 tablet Oral Given 11/19/17 1407)     Initial Impression / Assessment and Plan / ED Course  I have reviewed the triage vital signs and the nursing notes.  Pertinent labs & imaging results that were available during my care of the patient were reviewed by me and considered in my medical decision making (see chart for details).  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 This actually measures smaller than on the previous study of November 2018, though the density is higher. This could be due to some internal hemorrhage. Conceivably, this could relate to symptoms. CT Renal Stone Study [CG]  1349 Hgb urine dipstick: (!) LARGE [CG]  1350 RBC / HPF: 6-30 [CG]    Clinical Course User Index [CG]  Liberty Handy, PA-C    Differential diagnosis includes UTI/pyelonephritis, nephrolithiasis, PNA. Less likely AAA, dissection or PE. She is PERC negative and has no CP, SOB, pleuritic CP. No overlaying rash, however also considering prodromal shingles pain. Exam remarkable for R CVAT and TTP along left flank and left lateral rib cage. Has had dry cough x 4 days as well, pain may be muscular etiology from repeated coughing. She denies recent exertional activity or falls, however pain is positional and reproducible with palpation.    ED work up and imaging reviewed, remarkable for large Hgb, 6-30 RBCs and mucus in urine but also with SqE. UA does not look frankly infected. Mild anemia but stable. CXR without bony abnormality  or infiltrate. CT renal shows no urinary tract disease or stone, noted pancreas/liver cyst, known by patient for many years. [facility] and [facility] evaluated and told cyst is benign and will observe without further intervention, states she has ongoing acid reflux but this is unchanged. Doubt cyst is cause of pain today.   Final Clinical Impressions(s) / ED Diagnoses   Overall history, repeat exams and work up reassuring. Pt may have passed a stone and now showing hgb in urine. Less likely early stages of UTI, however she is concerned as she has h/o pyelonephritis. Will discharge with keflex and tylenol, she is to avoid ibuprofen due to h/o PUD. Urine culture pending. Discussed return precautions. Pt and daughter at bedside agreeable with tx plan.  Final diagnoses:  Flank pain  Viral upper respiratory tract infection with cough    ED Discharge Orders        Ordered    cephALEXin (KEFLEX) 500 MG capsule  4 times daily     11/19/17 1410       Liberty HandyGibbons, Waneda Klammer J, [place] JerseyPA-C 11/19/17 1420    Raeford RazorKohut, Stephen, MD 11/19/17 1428

## 2017-11-20 LAB — URINE CULTURE: Culture: NO GROWTH

## 2018-02-11 ENCOUNTER — Other Ambulatory Visit: Payer: Self-pay

## 2018-02-11 ENCOUNTER — Emergency Department (HOSPITAL_BASED_OUTPATIENT_CLINIC_OR_DEPARTMENT_OTHER)
Admission: EM | Admit: 2018-02-11 | Discharge: 2018-02-11 | Disposition: A | Discharge status: Home / Self Care | Payer: Self-pay | Attending: Emergency Medicine | Admitting: Emergency Medicine

## 2018-02-11 ENCOUNTER — Emergency Department (HOSPITAL_BASED_OUTPATIENT_CLINIC_OR_DEPARTMENT_OTHER): Payer: Self-pay

## 2018-02-11 ENCOUNTER — Encounter (HOSPITAL_BASED_OUTPATIENT_CLINIC_OR_DEPARTMENT_OTHER): Payer: Self-pay

## 2018-02-11 DIAGNOSIS — R059 Cough, unspecified: Secondary | ICD-10-CM

## 2018-02-11 DIAGNOSIS — R05 Cough: Secondary | ICD-10-CM | POA: Insufficient documentation

## 2018-02-11 MED ORDER — BENZONATATE 100 MG PO CAPS: 100.0000 mg | ORAL_CAPSULE | Freq: Three times a day (TID) | ORAL | 0 refills | Status: DC

## 2018-02-11 MED ORDER — DM-GUAIFENESIN ER 30-600 MG PO TB12: 1.0000 | ORAL_TABLET | Freq: Two times a day (BID) | ORAL | 0 refills | Status: DC | PRN

## 2018-02-11 MED FILL — BENZONATATE 100 MG CAPSULE: 100 | 7 days supply | Qty: 21 | Fill #0

## 2018-02-11 NOTE — ED Triage Notes (Signed)
C/o flu like sx x 3 weeks-NAD-steady gait 

## 2018-02-11 NOTE — ED Provider Notes (Signed)
[facility] EMERGENCY DEPARTMENT Provider Note   CSN: [id number] Arrival date & time: 02/11/18  1225     History   Chief Complaint Chief Complaint  Patient presents with  . Cough    HPI Jessica Fleming is a 52 y.o. female.  Patient is a 52 year old female with a history of reflux who presents with a cough.  She states she has had a 3-week history of coughing.  Occasionally is productive of some white-yellow sputum.  She denies any significant nasal congestion or postnasal drip.  She has some congestion in her throat.  Has been using over-the-counter cough medicines without improvement in symptoms.  She does not report any chest pain.  No shortness of breath.  No fevers.  She has been feeling a little fatigued.  She states occasionally she gets coughing spells like this was typically resolves in a few days with over-the-counter cough medicines.     Past Medical History:  Diagnosis Date  . GERD (gastroesophageal reflux disease)     Patient Active Problem List   Diagnosis Date Noted  . Left knee pain 09/20/2016  . 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 Morbid obesity due to excess calories (HCC) 10/25/2014  . Allergic rhinitis 04/12/2014  . B12 deficiency 04/12/2014  . Presbyopia 07/06/2013  . 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 Hypercholesterolemia 02/21/2013  . 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 No pertinent surgical history.   OB History   None      Home Medications    Prior to Admission medications   Medication Sig Start Date End Date Taking? Authorizing Provider  benzonatate (TESSALON) 100 MG capsule Take 1 capsule (100 mg total) by mouth every 8 (eight) hours. 02/11/18   Rolan Bucco, MD  dextromethorphan-guaiFENesin (MUCINEX DM) 30-600 MG 12hr tablet Take 1 tablet by mouth 2 (two)  times daily as needed for cough. 02/11/18   Rolan Bucco, MD  meclizine (ANTIVERT) 25 MG tablet Take 1 tablet (25 mg total) by mouth 3 (three) times daily as needed for dizziness. 08/19/17   Lavera Guise, MD  NAPROXEN PO Take by mouth.    [provider]  oxyCODONE-acetaminophen (PERCOCET) 5-325 MG tablet Take 1 tablet by mouth every 6 (six) hours as needed. 05/22/17   Little, Ambrose Finland, MD  promethazine (PHENERGAN) 25 MG tablet Take 1 tablet (25 mg total) by mouth every 6 (six) hours as needed for nausea or vomiting. 05/22/17   Little, Ambrose Finland, MD  promethazine (PHENERGAN) 25 MG tablet Take 1 tablet (25 mg total) by mouth every 6 (six) hours as needed for nausea or vomiting. 08/19/17   Lavera Guise, MD  tiZANidine (ZANAFLEX) 4 MG capsule 1/2 - 1 tab by mouth three times a day as needed for muscle spasms    [provider]  traMADol (ULTRAM) 50 MG tablet Take 1 tablet (50 mg total) by mouth every 6 (six) hours as needed. 09/07/16   Charlynne Pander, MD  traMADol (ULTRAM) 50 MG tablet Take 1 tablet (50 mg total) by mouth every 6 (six) hours as needed. 06/25/17   Vanetta Mulders, MD    Family History No family history on file.  Social History Social History   Tobacco Use  . Smoking status: Never Smoker  . Smokeless tobacco: Never Used  Substance Use Topics  . Alcohol use: No  .  Drug use: No     Allergies   Fish oil; Hydrocodone-homatropine; and Prednisone   Review of Systems Review of Systems  Constitutional: Positive for fatigue. Negative for chills, diaphoresis and fever.  HENT: Negative for congestion, rhinorrhea and sneezing.   Eyes: Negative.   Respiratory: Positive for cough. Negative for chest tightness and shortness of breath.   Cardiovascular: Negative for chest pain and leg swelling.  Gastrointestinal: Negative for abdominal pain, blood in stool, diarrhea, nausea and vomiting.  Genitourinary: Negative for difficulty urinating, flank pain,  frequency and hematuria.  Musculoskeletal: Negative for arthralgias and back pain.  Skin: Negative for rash.  Neurological: Negative for dizziness, speech difficulty, weakness, numbness and headaches.     Physical Exam Updated Vital Signs BP 109/75 (BP Location: Left Arm)   Pulse 76   Temp 98.3 F (36.8 C) (Oral)   Resp 20   Ht  (1.575 m)   Wt 95.9 kg (211 lb 8 oz)   SpO2 95%   BMI 38.68 kg/m   Physical Exam  Constitutional: She is oriented to person, place, and time. She appears well-developed and well-nourished.  HENT:  Head: Normocephalic and atraumatic.  No erythema or exudates, uvula is midline, no trismus  Eyes: Pupils are equal, round, and reactive to light.  Neck: Normal range of motion. Neck supple.  Cardiovascular: Normal rate, regular rhythm and normal heart sounds.  Pulmonary/Chest: Effort normal and breath sounds normal. No respiratory distress. She has no wheezes. She has no rales. She exhibits no tenderness.  Abdominal: Soft. Bowel sounds are normal. There is no tenderness. There is no rebound and no guarding.  Musculoskeletal: Normal range of motion. She exhibits no edema.  Lymphadenopathy:    She has no cervical adenopathy.  Neurological: She is alert and oriented to person, place, and time.  Skin: Skin is warm and dry. No rash noted.  Psychiatric: She has a normal mood and affect.     ED Treatments / Results  Labs (all labs ordered are listed, but only abnormal results are displayed) Labs Reviewed - No data to display  EKG None  Radiology Dg Chest 2 View  Result Date: 02/11/2018 CLINICAL DATA:  Productive cough for 3 weeks.  Fever. EXAM: CHEST - 2 VIEW COMPARISON:  11/19/2017 FINDINGS: The heart size and mediastinal contours are within normal limits. Both lungs are clear. The visualized skeletal structures are unremarkable. IMPRESSION: No active cardiopulmonary disease. Electronically Signed   By: Myles Rosenthal M.D.   On: 02/11/2018 13:11     Procedures Procedures (including critical care time)  Medications Ordered in ED Medications - No data to display   Initial Impression / Assessment and Plan / ED Course  I have reviewed the triage vital signs and the nursing notes.  Pertinent labs & imaging results that were available during my care of the patient were reviewed by me and considered in my medical decision making (see chart for details).     Patient is a 52 year old female who presents with a cough.  She has no evidence of pneumonia on x-ray.  No hypoxia.  Her lungs are clear without wheezing or rhonchi.  Her throat exam is benign.  She was discharged home in good condition.  She likely has a viral illness.  I did discuss that reflux may also worsen coughing but she does not feel that her reflux is a problem at this point.  She was given a prescription for guaifenesin DM and Tessalon Perles.  She was encouraged  to have follow-up with her PCP.  Her daughter states that they have been trying to make an appointment for follow-up.  Return precautions were given.  Final Clinical Impressions(s) / ED Diagnoses   Final diagnoses:  Cough    ED Discharge Orders        Ordered    benzonatate (TESSALON) 100 MG capsule  Every 8 hours     02/11/18 1400    dextromethorphan-guaiFENesin (MUCINEX DM) 30-600 MG 12hr tablet  2 times daily PRN     02/11/18 1400       Rolan Bucco, MD 02/11/18 1421

## 2018-09-15 ENCOUNTER — Other Ambulatory Visit: Payer: Self-pay

## 2018-09-15 ENCOUNTER — Encounter (HOSPITAL_BASED_OUTPATIENT_CLINIC_OR_DEPARTMENT_OTHER): Payer: Self-pay | Admitting: *Deleted

## 2018-09-15 DIAGNOSIS — J111 Influenza due to unidentified influenza virus with other respiratory manifestations: Secondary | ICD-10-CM | POA: Insufficient documentation

## 2018-09-15 NOTE — ED Triage Notes (Signed)
Sore throat, eyes burning, body aches.

## 2018-09-16 ENCOUNTER — Emergency Department (HOSPITAL_BASED_OUTPATIENT_CLINIC_OR_DEPARTMENT_OTHER)
Admission: EM | Admit: 2018-09-16 | Discharge: 2018-09-16 | Disposition: A | Discharge status: Home / Self Care | Payer: Medicaid Other | Attending: Emergency Medicine | Admitting: Emergency Medicine

## 2018-09-16 DIAGNOSIS — R69 Illness, unspecified: Secondary | ICD-10-CM

## 2018-09-16 DIAGNOSIS — J111 Influenza due to unidentified influenza virus with other respiratory manifestations: Secondary | ICD-10-CM

## 2018-09-16 MED ORDER — GUAIFENESIN-CODEINE 100-10 MG/5ML PO SOLN
10.0000 mL | Freq: Four times a day (QID) | ORAL | 0 refills | Status: DC | PRN
Start: 2018-09-16 — End: 2018-11-20

## 2018-09-16 NOTE — ED Provider Notes (Signed)
[facility] EMERGENCY DEPARTMENT Provider Note   CSN: [id number] Arrival date & time: 09/15/18  [id number]     History   Chief Complaint Chief Complaint  Patient presents with  . Cough    HPI Jessica Fleming is a 52 y.o. female.  Patient is a 52 year old female presenting with complaints of cough, congestion, body aches for the past 2 days.  She denies definite fever but has felt warm.  Several members of her family have been ill with similar symptoms.  The history is provided by the patient.  Cough  This is a new problem. The current episode started 2 days ago. The problem occurs constantly. The problem has been gradually worsening. The cough is non-productive. Associated symptoms include chills. Pertinent negatives include no chest pain, no rhinorrhea and no shortness of breath. She has tried decongestants for the symptoms.    Past Medical History:  Diagnosis Date  . GERD (gastroesophageal reflux disease)     Patient Active Problem List   Diagnosis Date Noted  . Left knee pain 09/20/2016  . 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 Morbid obesity due to excess calories (HCC) 10/25/2014  . Allergic rhinitis 04/12/2014  . B12 deficiency 04/12/2014  . Presbyopia 07/06/2013  . 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 Hypercholesterolemia 02/21/2013  . 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 No pertinent surgical history.   OB History   No obstetric history on file.      Home Medications    Prior to Admission medications   Medication Sig Start Date End Date Taking? Authorizing Provider  tiZANidine (ZANAFLEX) 4 MG capsule 1/2 - 1 tab by mouth three times a day as needed for muscle spasms   Yes [provider]  benzonatate (TESSALON) 100 MG capsule Take 1 capsule (100 mg total) by mouth every 8  (eight) hours. 02/11/18   Rolan BuccoBelfi, Melanie, MD  dextromethorphan-guaiFENesin (MUCINEX DM) 30-600 MG 12hr tablet Take 1 tablet by mouth 2 (two) times daily as needed for cough. 02/11/18   Rolan BuccoBelfi, Melanie, MD  meclizine (ANTIVERT) 25 MG tablet Take 1 tablet (25 mg total) by mouth 3 (three) times daily as needed for dizziness. 08/19/17   Lavera GuiseLiu, Dana Duo, MD  NAPROXEN PO Take by mouth.    [provider]  oxyCODONE-acetaminophen (PERCOCET) 5-325 MG tablet Take 1 tablet by mouth every 6 (six) hours as needed. 05/22/17   Little, Ambrose Finlandachel Morgan, MD  promethazine (PHENERGAN) 25 MG tablet Take 1 tablet (25 mg total) by mouth every 6 (six) hours as needed for nausea or vomiting. 05/22/17   Little, Ambrose Finlandachel Morgan, MD  promethazine (PHENERGAN) 25 MG tablet Take 1 tablet (25 mg total) by mouth every 6 (six) hours as needed for nausea or vomiting. 08/19/17   Lavera GuiseLiu, Dana Duo, MD  traMADol (ULTRAM) 50 MG tablet Take 1 tablet (50 mg total) by mouth every 6 (six) hours as needed. 09/07/16   Charlynne PanderYao, David Hsienta, MD  traMADol (ULTRAM) 50 MG tablet Take 1 tablet (50 mg total) by mouth every 6 (six) hours as needed. 06/25/17   Vanetta MuldersZackowski, Scott, MD    Family History No family history on file.  Social History Social History   Tobacco Use  . Smoking status: Never Smoker  . Smokeless tobacco: Never Used  Substance Use Topics  . Alcohol use: No  . Drug use: No  Allergies   Fish oil; Hydrocodone-homatropine; and Prednisone   Review of Systems Review of Systems  Constitutional: Positive for chills.  HENT: Negative for rhinorrhea.   Respiratory: Positive for cough. Negative for shortness of breath.   Cardiovascular: Negative for chest pain.  All other systems reviewed and are negative.    Physical Exam Updated Vital Signs BP 135/82   Pulse 72   Temp (!) 97.5 F (36.4 C) (Oral)   Resp 16   Ht  (1.575 m)   Wt 88.5 kg   SpO2 100%   BMI 35.67 kg/m   Physical Exam Vitals signs and nursing note  reviewed.  Constitutional:      General: She is not in acute distress.    Appearance: Normal appearance. She is well-developed. She is not diaphoretic.  HENT:     Head: Normocephalic and atraumatic.     Right Ear: Tympanic membrane normal.     Left Ear: Tympanic membrane normal.     Nose: No congestion.     Mouth/Throat:     Mouth: Mucous membranes are moist.     Pharynx: No oropharyngeal exudate or posterior oropharyngeal erythema.  Neck:     Musculoskeletal: Normal range of motion and neck supple.  Cardiovascular:     Rate and Rhythm: Normal rate and regular rhythm.     Heart sounds: No murmur. No friction rub. No gallop.   Pulmonary:     Effort: Pulmonary effort is normal. No respiratory distress.     Breath sounds: Normal breath sounds. No wheezing.  Abdominal:     General: Bowel sounds are normal. There is no distension.     Palpations: Abdomen is soft.     Tenderness: There is no abdominal tenderness.  Musculoskeletal: Normal range of motion.  Skin:    General: Skin is warm and dry.  Neurological:     Mental Status: She is alert and oriented to person, place, and time.      ED Treatments / Results  Labs (all labs ordered are listed, but only abnormal results are displayed) Labs Reviewed - No data to display  EKG None  Radiology No results found.  Procedures Procedures (including critical care time)  Medications Ordered in ED Medications - No data to display   Initial Impression / Assessment and Plan / ED Course  I have reviewed the triage vital signs and the nursing notes.  Pertinent labs & imaging results that were available during my care of the patient were reviewed by me and considered in my medical decision making (see chart for details).  Patient presents with URI symptoms for the past several days.  I highly suspect a viral etiology, possibly influenza.  Patient's vitals are stable and oxygen saturations are normal.  She is in no distress.  Patient  will be prescribed Robitussin with codeine which she can take as needed for cough and continue other over-the-counter medications as needed.  Final Clinical Impressions(s) / ED Diagnoses   Final diagnoses:  None    ED Discharge Orders    None       Geoffery Lyons, MD 09/16/18 [phone]

## 2018-09-16 NOTE — Discharge Instructions (Addendum)
Robitussin with codeine as needed for cough.  Ibuprofen 600 mg every 6 hours as needed for pain.  Plenty of fluids and get plenty of rest.  Follow-up with your primary doctor if symptoms or not improving in the next week.

## 2018-10-26 ENCOUNTER — Encounter (HOSPITAL_BASED_OUTPATIENT_CLINIC_OR_DEPARTMENT_OTHER): Payer: Self-pay | Admitting: Emergency Medicine

## 2018-10-26 ENCOUNTER — Emergency Department (HOSPITAL_BASED_OUTPATIENT_CLINIC_OR_DEPARTMENT_OTHER)
Admission: EM | Admit: 2018-10-26 | Discharge: 2018-10-26 | Disposition: A | Discharge status: Home / Self Care | Payer: Medicaid Other | Attending: Emergency Medicine | Admitting: Emergency Medicine

## 2018-10-26 ENCOUNTER — Other Ambulatory Visit: Payer: Self-pay

## 2018-10-26 DIAGNOSIS — N390 Urinary tract infection, site not specified: Secondary | ICD-10-CM

## 2018-10-26 LAB — URINALYSIS, ROUTINE W REFLEX MICROSCOPIC
Bilirubin Urine: NEGATIVE
GLUCOSE, UA: NEGATIVE mg/dL
Ketones, ur: NEGATIVE mg/dL
Nitrite: NEGATIVE
PH: 5.5 (ref 5.0–8.0)
Protein, ur: 30 mg/dL — AB
SPECIFIC GRAVITY, URINE: 1.025 (ref 1.005–1.030)

## 2018-10-26 LAB — URINALYSIS, MICROSCOPIC (REFLEX): RBC / HPF: >50 RBC/hpf (ref 0–5)

## 2018-10-26 LAB — PREGNANCY, URINE: Preg Test, Ur: NEGATIVE

## 2018-10-26 MED ORDER — ACETAMINOPHEN 500 MG PO TABS
1000.0000 mg | ORAL_TABLET | Freq: Once | ORAL | Status: AC
Start: 2018-10-26 — End: 2018-10-26
  Administered 2018-10-26: 1000 mg via ORAL
  Filled 2018-10-26: qty 2

## 2018-10-26 MED ORDER — CEPHALEXIN 250 MG PO CAPS
1000.0000 mg | ORAL_CAPSULE | Freq: Once | ORAL | Status: AC
  Administered 2018-10-26: 1000 mg via ORAL
  Filled 2018-10-26: qty 4

## 2018-10-26 MED ORDER — CEPHALEXIN 500 MG PO CAPS: 500.0000 mg | ORAL_CAPSULE | Freq: Four times a day (QID) | ORAL | 0 refills | Status: DC

## 2018-10-26 MED ORDER — IBUPROFEN 800 MG PO TABS
800.0000 mg | ORAL_TABLET | Freq: Once | ORAL | Status: AC
  Administered 2018-10-26: 800 mg via ORAL
  Filled 2018-10-26: qty 1

## 2018-10-26 MED ORDER — PHENAZOPYRIDINE HCL 200 MG PO TABS: 200.0000 mg | ORAL_TABLET | Freq: Three times a day (TID) | ORAL | 0 refills | Status: DC | PRN

## 2018-10-26 NOTE — Discharge Instructions (Signed)
Call your family doctor and discuss your symptoms and see if they feel that you should see a urologist or other specialist for your recurrent UTI's.    Return to the ED for fever, worsening pain.   Take 4 over the counter ibuprofen tablets 3 times a day or 2 over-the-counter naproxen tablets twice a day for pain. Also take tylenol 1000mg (2 extra strength) four times a day.

## 2018-10-26 NOTE — ED Triage Notes (Signed)
Pts daughter states that she thinks her mother has a uti. States dysuria and frequency. States symptoms started yesterday. C/o bilateral low back pain and leg pain

## 2018-10-26 NOTE — ED Notes (Signed)
Pt ambulated to restroom to attempt and obtain a urine sample. NAD noted. Ambulated without assistance

## 2018-10-26 NOTE — ED Provider Notes (Signed)
[facility] EMERGENCY DEPARTMENT Provider Note   CSN: [id number] Arrival date & time: 10/26/18  2142     History   Chief Complaint Chief Complaint  Patient presents with  . Dysuria    HPI Jessica Fleming is a 53 y.o. female.  53 yo F with a chief complaint of dysuria.  This been going on since last night.  Having increased frequency as well as well as bilateral flank pain.  Denies fevers or chills denies nausea or vomiting.  Has had recurrent urinary tract infections in the past year and thinks this feels the same.  The history is provided by a relative.  Dysuria  Pain quality:  Sharp and shooting Pain severity:  Severe Onset quality:  Sudden Duration:  1 day Timing:  Constant Progression:  Worsening Chronicity:  Recurrent Recent urinary tract infections: yes (about 6 months ago)   Relieved by:  Nothing Worsened by:  Nothing Ineffective treatments:  None tried Urinary symptoms: frequent urination and hesitancy   Urinary symptoms: no discolored urine, no foul-smelling urine and no bladder incontinence   Associated symptoms: flank pain   Associated symptoms: no abdominal pain, no fever, no nausea and no vomiting   Risk factors: recurrent urinary tract infections     Past Medical History:  Diagnosis Date  . GERD (gastroesophageal reflux disease)     Patient Active Problem List   Diagnosis Date Noted  . Left knee pain 09/20/2016  . 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 Morbid obesity due to excess calories (HCC) 10/25/2014  . Allergic rhinitis 04/12/2014  . B12 deficiency 04/12/2014  . Presbyopia 07/06/2013  . 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 Hypercholesterolemia 02/21/2013  . 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 No pertinent surgical history.   OB History   No obstetric history on  file.      Home Medications    Prior to Admission medications   Medication Sig Start Date End Date Taking? Authorizing Provider  benzonatate (TESSALON) 100 MG capsule Take 1 capsule (100 mg total) by mouth every 8 (eight) hours. 02/11/18   Rolan BuccoBelfi, Melanie, MD  cephALEXin (KEFLEX) 500 MG capsule Take 1 capsule (500 mg total) by mouth 4 (four) times daily. 10/26/18   Melene PlanFloyd, Adolphe Fortunato, DO  dextromethorphan-guaiFENesin [facility](MUCINEX DM) 30-600 MG 12hr tablet Take 1 tablet by mouth 2 (two) times daily as needed for cough. 02/11/18   Rolan BuccoBelfi, Melanie, MD  guaiFENesin-codeine 100-10 MG/5ML syrup Take 10 mLs by mouth every 6 (six) hours as needed for cough. 09/16/18   Geoffery Lyonselo, Douglas, MD  meclizine (ANTIVERT) 25 MG tablet Take 1 tablet (25 mg total) by mouth 3 (three) times daily as needed for dizziness. 08/19/17   Lavera GuiseLiu, Dana Duo, MD  NAPROXEN PO Take by mouth.    [provider]  oxyCODONE-acetaminophen (PERCOCET) 5-325 MG tablet Take 1 tablet by mouth every 6 (six) hours as needed. 05/22/17   Little, Ambrose Finlandachel Morgan, MD  phenazopyridine (PYRIDIUM) 200 MG tablet Take 1 tablet (200 mg total) by mouth 3 (three) times daily as needed for pain. 10/26/18   Melene PlanFloyd, Robbye Dede, DO  promethazine (PHENERGAN) 25 MG tablet Take 1 tablet (25 mg total) by mouth every 6 (six) hours as needed for nausea or vomiting. 05/22/17   Little, Ambrose Finlandachel Morgan, MD  promethazine (PHENERGAN) 25 MG tablet Take 1 tablet (25 mg total) by mouth every  6 (six) hours as needed for nausea or vomiting. 08/19/17   Lavera GuiseLiu, Dana Duo, MD  tiZANidine (ZANAFLEX) 4 MG capsule 1/2 - 1 tab by mouth three times a day as needed for muscle spasms    [provider]  traMADol (ULTRAM) 50 MG tablet Take 1 tablet (50 mg total) by mouth every 6 (six) hours as needed. 09/07/16   Charlynne PanderYao, David Hsienta, MD  traMADol (ULTRAM) 50 MG tablet Take 1 tablet (50 mg total) by mouth every 6 (six) hours as needed. 06/25/17   Vanetta MuldersZackowski, Scott, MD    Family History No family history on  file.  Social History Social History   Tobacco Use  . Smoking status: Never Smoker  . Smokeless tobacco: Never Used  Substance Use Topics  . Alcohol use: No  . Drug use: No     Allergies   Fish oil; Hydrocodone-homatropine; and Prednisone   Review of Systems Review of Systems  Constitutional: Negative for chills and fever.  HENT: Negative for congestion and rhinorrhea.   Eyes: Negative for redness and visual disturbance.  Respiratory: Negative for shortness of breath and wheezing.   Cardiovascular: Negative for chest pain and palpitations.  Gastrointestinal: Negative for abdominal pain, nausea and vomiting.  Genitourinary: Positive for dysuria and flank pain. Negative for urgency.  Musculoskeletal: Negative for arthralgias and myalgias.  Skin: Negative for pallor and wound.  Neurological: Negative for dizziness and headaches.     Physical Exam Updated Vital Signs BP (!) 155/111   Pulse 71   Temp 98.1 F (36.7 C) (Oral)   Resp 20   Ht 5\' 2"  (1.575 m)   Wt 86.2 kg   SpO2 100%   BMI 34.75 kg/m   Physical Exam Vitals signs and nursing note reviewed.  Constitutional:      General: She is not in acute distress.    Appearance: She is well-developed. She is not diaphoretic.  HENT:     Head: Normocephalic and atraumatic.  Eyes:     Pupils: Pupils are equal, round, and reactive to light.  Neck:     Musculoskeletal: Normal range of motion and neck supple.  Cardiovascular:     Rate and Rhythm: Normal rate and regular rhythm.     Heart sounds: No murmur. No friction rub. No gallop.   Pulmonary:     Effort: Pulmonary effort is normal.     Breath sounds: No wheezing or rales.  Abdominal:     General: There is no distension.     Palpations: Abdomen is soft.     Tenderness: There is no abdominal tenderness. There is no right CVA tenderness or left CVA tenderness.  Musculoskeletal:        General: No tenderness.  Skin:    General: Skin is warm and dry.   Neurological:     Mental Status: She is alert and oriented to person, place, and time.  Psychiatric:        Behavior: Behavior normal.      ED Treatments / Results  Labs (all labs ordered are listed, but only abnormal results are displayed) Labs Reviewed  URINALYSIS, ROUTINE W REFLEX MICROSCOPIC - Abnormal; Notable for the following components:      Result Value   APPearance CLOUDY (*)    Hgb urine dipstick LARGE (*)    Protein, ur 30 (*)    Leukocytes, UA LARGE (*)    All other components within normal limits  URINALYSIS, MICROSCOPIC (REFLEX) - Abnormal; Notable for the following components:  Bacteria, UA MANY (*)    All other components within normal limits  URINE CULTURE  PREGNANCY, URINE    EKG None  Radiology No results found.  Procedures Procedures (including critical care time)  Medications Ordered in ED Medications  cephALEXin (KEFLEX) capsule 1,000 mg (has no administration in time range)  acetaminophen (TYLENOL) tablet 1,000 mg (has no administration in time range)  ibuprofen (ADVIL,MOTRIN) tablet 800 mg (has no administration in time range)     Initial Impression / Assessment and Plan / ED Course  I have reviewed the triage vital signs and the nursing notes.  Pertinent labs & imaging results that were available during my care of the patient were reviewed by me and considered in my medical decision making (see chart for details).     53 yo F with a chief complaint of a possible urinary tract infection.  Going on for the past day.  She has large leukocyte esterase with too numerous to count bacteria.  Will treat with Keflex as she is complaining of some back pain though does not have pain over the CVAs.  We will have her follow-up with her PCP.  She does not have a PCP listed in the chart and she has had multiple urinary tract infections in the recent past, will give a urology referral to use as needed.  10:33 PM:  I have discussed the  diagnosis/risks/treatment options with the patient and family and believe the pt to be eligible for discharge home to follow-up with PCP, urology. We also discussed returning to the ED immediately if new or worsening sx occur. We discussed the sx which are most concerning (e.g., sudden worsening pain, fever, inability to tolerate by mouth) that necessitate immediate return. Medications administered to the patient during their visit and any new prescriptions provided to the patient are listed below.  Medications given during this visit Medications  cephALEXin (KEFLEX) capsule 1,000 mg (has no administration in time range)  acetaminophen (TYLENOL) tablet 1,000 mg (has no administration in time range)  ibuprofen (ADVIL,MOTRIN) tablet 800 mg (has no administration in time range)     The patient appears reasonably screen and/or stabilized for discharge and I doubt any other medical condition or other [facility] requiring further screening, evaluation, or treatment in the ED at this time prior to discharge.    Final Clinical Impressions(s) / ED Diagnoses   Final diagnoses:  Lower urinary tract infectious disease    ED Discharge Orders         Ordered    cephALEXin (KEFLEX) 500 MG capsule  4 times daily     10/26/18 2229    phenazopyridine (PYRIDIUM) 200 MG tablet  3 times daily PRN     10/26/18 2229           Melene Plan, DO 10/26/18 2233

## 2018-10-29 LAB — URINE CULTURE: Culture: 20000 — AB

## 2018-10-30 ENCOUNTER — Telehealth: Payer: Self-pay

## 2018-10-30 NOTE — Telephone Encounter (Signed)
Post ED Visit - Positive Culture Follow-up  Culture report reviewed by antimicrobial stewardship pharmacist:  []  Enzo Bi, Pharm.D. []  Celedonio Miyamoto, Pharm.D., BCPS AQ-ID []  Garvin Fila, Pharm.D., BCPS []  Georgina Pillion, Pharm.D., BCPS []  [place], [address].D., BCPS, AAHIVP []  Estella Husk, Pharm.D., BCPS, AAHIVP []  Lysle Pearl, PharmD, BCPS []  Phillips Climes, PharmD, BCPS []  Agapito Games, PharmD, BCPS []  Verlan Friends, PharmD Mervyn Gay Pharm D Positive urine culture Treated with Cephalexin, organism sensitive to the same and no further patient follow-up is required at this time.  Jerry Caras 10/30/2018, 11:50 AM

## 2018-11-16 ENCOUNTER — Other Ambulatory Visit: Payer: Self-pay

## 2018-11-16 ENCOUNTER — Encounter (HOSPITAL_BASED_OUTPATIENT_CLINIC_OR_DEPARTMENT_OTHER): Payer: Self-pay | Admitting: Emergency Medicine

## 2018-11-16 ENCOUNTER — Emergency Department (HOSPITAL_BASED_OUTPATIENT_CLINIC_OR_DEPARTMENT_OTHER)
Admission: EM | Admit: 2018-11-16 | Discharge: 2018-11-16 | Disposition: A | Discharge status: Home / Self Care | Payer: Medicaid Other | Attending: Emergency Medicine | Admitting: Emergency Medicine

## 2018-11-16 DIAGNOSIS — M79602 Pain in left arm: Secondary | ICD-10-CM | POA: Diagnosis present

## 2018-11-16 DIAGNOSIS — Z79899 Other long term (current) drug therapy: Secondary | ICD-10-CM | POA: Insufficient documentation

## 2018-11-16 DIAGNOSIS — Q799 Congenital malformation of musculoskeletal system, unspecified: Secondary | ICD-10-CM | POA: Insufficient documentation

## 2018-11-16 DIAGNOSIS — M62830 Muscle spasm of back: Secondary | ICD-10-CM | POA: Diagnosis not present

## 2018-11-16 MED ORDER — KETOROLAC TROMETHAMINE 60 MG/2ML IM SOLN
15.0000 mg | Freq: Once | INTRAMUSCULAR | Status: AC
  Administered 2018-11-16: 15 mg via INTRAMUSCULAR
  Filled 2018-11-16: qty 2

## 2018-11-16 NOTE — ED Triage Notes (Signed)
States that she was awake and then started experiencing the pain. Numbness and tingling in her left arm.

## 2018-11-16 NOTE — Discharge Instructions (Addendum)
You may use over-the-counter Motrin (Ibuprofen), Acetaminophen (Tylenol), topical muscle creams such as SalonPas, Icy Hot, Bengay, etc. Please stretch, apply heat, and have massage therapy for additional assistance. ° °

## 2018-11-16 NOTE — ED Triage Notes (Signed)
Pt presents with left sided neck, shoulder, arm and chest pain that started a couple of hours ago and is increasing in intensity. Per family she tried a xanaflex without relief. Described as a sharp pain that movement makes worse

## 2018-11-16 NOTE — ED Provider Notes (Signed)
[facility] EMERGENCY DEPARTMENT Provider Note  CSN: [id number] Arrival date & time: 11/16/18 8588  Chief Complaint(s) Arm Pain  HPI Jessica Fleming is a 53 y.o. female who presents to the emergency department with sudden onset left shoulder, neck, arm and anterior chest pain.  Onset was 5 hours ago.  Pain is been constant.  Severe and cramping in nature.  Associated with left arm paresthesias.  No weakness or loss of sensation.  Pain is exacerbated with range of motion of the left shoulder and neck.  Also exacerbated with palpation of the left upper back/shoulder girdle region.  Tried taking Zanaflex without relief.  She denies any trauma or falls.  No substernal chest pain or shortness of breath.  No nausea vomiting.  No recent fevers or infections.  Denies any other physical complaints.  HPI  Past Medical History Past Medical History:  Diagnosis Date  . GERD (gastroesophageal reflux disease)    Patient Active Problem List   Diagnosis Date Noted  . Left knee pain 09/20/2016  . 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 Morbid obesity due to excess calories (HCC) 10/25/2014  . Allergic rhinitis 04/12/2014  . B12 deficiency 04/12/2014  . Presbyopia 07/06/2013  . 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 Hypercholesterolemia 02/21/2013  . 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 Authorizing Provider  benzonatate (TESSALON) 100 MG capsule Take 1 capsule (100 mg total) by mouth every 8 (eight) hours. 02/11/18   Rolan Bucco, MD  cephALEXin (KEFLEX) 500 MG capsule Take 1 capsule (500 mg total) by mouth 4 (four) times daily. 10/26/18   Melene Plan, DO  dextromethorphan-guaiFENesin [facility] DM) 30-600 MG 12hr tablet Take 1 tablet by mouth 2 (two) times  daily as needed for cough. 02/11/18   Rolan Bucco, MD  guaiFENesin-codeine 100-10 MG/5ML syrup Take 10 mLs by mouth every 6 (six) hours as needed for cough. 09/16/18   Geoffery Lyons, MD  meclizine (ANTIVERT) 25 MG tablet Take 1 tablet (25 mg total) by mouth 3 (three) times daily as needed for dizziness. 08/19/17   Lavera Guise, MD  NAPROXEN PO Take by mouth.    [provider]  oxyCODONE-acetaminophen (PERCOCET) 5-325 MG tablet Take 1 tablet by mouth every 6 (six) hours as needed. 05/22/17   Little, Ambrose Finland, MD  phenazopyridine (PYRIDIUM) 200 MG tablet Take 1 tablet (200 mg total) by mouth 3 (three) times daily as needed for pain. 10/26/18   Melene Plan, DO  promethazine (PHENERGAN) 25 MG tablet Take 1 tablet (25 mg total) by mouth every 6 (six) hours as needed for nausea or vomiting. 05/22/17   Little, Ambrose Finland, MD  promethazine (PHENERGAN) 25 MG tablet Take 1 tablet (25 mg total) by mouth every 6 (six) hours as needed for nausea or vomiting. 08/19/17   Lavera Guise, MD  tiZANidine (ZANAFLEX) 4 MG capsule 1/2 - 1 tab by mouth three times a day as needed for muscle spasms    [provider]  traMADol (ULTRAM) 50 MG tablet Take 1 tablet (50 mg total) by mouth every 6 (six) hours as needed. 09/07/16   Charlynne Pander, MD  traMADol (ULTRAM) 50 MG tablet Take 1 tablet (50 mg total) by mouth every 6 (six) hours as needed. 06/25/17   Vanetta Mulders, MD  Past Surgical History History reviewed. No pertinent surgical history. Family History No family history on file.  Social History Social History   Tobacco Use  . Smoking status: Never Smoker  . Smokeless tobacco: Never Used  Substance Use Topics  . Alcohol use: No  . Drug use: No   Allergies Fish oil; Hydrocodone-homatropine; and Prednisone  Review of Systems Review of Systems All other  systems are reviewed and are negative for acute change except as noted in the HPI  Physical Exam Vital Signs  I have reviewed the triage vital signs BP 114/66   Pulse 67   Temp 98.8 F (37.1 C)   Resp (!) 22   Ht 5\' 2"  (1.575 m)   Wt 86.2 kg   SpO2 98%   BMI 34.75 kg/m   Physical Exam Vitals signs reviewed.  Constitutional:      General: She is not in acute distress.    Appearance: She is well-developed. She is not diaphoretic.  HENT:     Head: Normocephalic and atraumatic.     Nose: Nose normal.  Eyes:     General: No scleral icterus.       Right eye: No discharge.        Left eye: No discharge.     Conjunctiva/sclera: Conjunctivae normal.     Pupils: Pupils are equal, round, and reactive to light.  Neck:     Musculoskeletal: Normal range of motion and neck supple.   Cardiovascular:     Rate and Rhythm: Normal rate and regular rhythm.     Heart sounds: No murmur. No friction rub. No gallop.   Pulmonary:     Effort: Pulmonary effort is normal. No respiratory distress.     Breath sounds: Normal breath sounds. No stridor. No rales.  Chest:     Chest wall: Tenderness present.    Abdominal:     General: There is no distension.     Palpations: Abdomen is soft.     Tenderness: There is no abdominal tenderness.  Musculoskeletal:     Cervical back: She exhibits tenderness and spasm. She exhibits no bony tenderness.       Back:  Skin:    General: Skin is warm and dry.     Findings: No erythema or rash.  Neurological:     Mental Status: She is alert and oriented to person, place, and time.     ED Results and Treatments Labs (all labs ordered are listed, but only abnormal results are displayed) Labs Reviewed - No data to display                                                                                                                       EKG  EKG Interpretation  Date/Time:  Sunday November 16 2018 03:51:50 EST Ventricular Rate:  68 PR Interval:    QRS  Duration: 90 QT Interval:  428 QTC Calculation: 456 R Axis:   37 Text Interpretation:  Sinus rhythm Low voltage,  precordial leads No significant change since last tracing Confirmed by Drema Pry [phone]) on 11/16/2018 3:57:39 AM      Radiology No results found. Pertinent labs & imaging results that were available during my care of the patient were reviewed by me and considered in my medical decision making (see chart for details).  Medications Ordered in ED Medications  ketorolac (TORADOL) injection 15 mg (15 mg Intramuscular Given 11/16/18 0440)                                                                                                                                    Procedures Procedures  (including critical care time)  Medical Decision Making / ED Course I have reviewed the nursing notes for this encounter and the patient's prior records (if available in EHR or on provided paperwork).    Patient presents with left shoulder girdle musculature pain.  Exam notable for muscle spasm in this area.  Palpation of the spastic trapezius middle fibers exacerbated the patient's pain.  Acupressure applied to this area resulting in improved pain.  Patient was given an additional IM Toradol.  Lungs were clear to auscultation bilaterally.  EKG obtained in triage which was nonischemic and without acute changes.  Low suspicion for cardiac etiology, pulmonary embolism, aortic dissection, or esophageal perforation, pneumothorax, pneumonia.  The patient appears reasonably screened and/or stabilized for discharge and I doubt any other medical condition or other [facility] requiring further screening, evaluation, or treatment in the ED at this time prior to discharge.  The patient is safe for discharge with strict return precautions.  Final Clinical Impression(s) / ED Diagnoses Final diagnoses:  Spasm of thoracic back muscle   Disposition: Discharge  Condition: Good  I have discussed the results,  Dx and Tx plan with the patient and daughter who expressed understanding and agree(s) with the plan. Discharge instructions discussed at great length. The patient was given strict return precautions who verbalized understanding of the instructions. No further questions at time of discharge.    ED Discharge Orders    None       Follow Up: Primary care provider  Schedule an appointment as soon as possible for a visit  As needed      This chart was dictated using voice recognition software.  Despite best efforts to proofread,  errors can occur which can change the documentation meaning.   Nira Conn, MD 11/16/18 [phone]

## 2018-11-16 NOTE — ED Notes (Signed)
ED Provider at bedside. 

## 2018-11-17 ENCOUNTER — Emergency Department (HOSPITAL_BASED_OUTPATIENT_CLINIC_OR_DEPARTMENT_OTHER): Payer: Medicaid Other

## 2018-11-17 ENCOUNTER — Emergency Department (HOSPITAL_BASED_OUTPATIENT_CLINIC_OR_DEPARTMENT_OTHER)
Admission: EM | Admit: 2018-11-17 | Discharge: 2018-11-18 | Disposition: A | Discharge status: Home / Self Care | Payer: Medicaid Other | Attending: Emergency Medicine | Admitting: Emergency Medicine

## 2018-11-17 ENCOUNTER — Other Ambulatory Visit: Payer: Self-pay

## 2018-11-17 ENCOUNTER — Encounter (HOSPITAL_BASED_OUTPATIENT_CLINIC_OR_DEPARTMENT_OTHER): Payer: Self-pay | Admitting: Emergency Medicine

## 2018-11-17 DIAGNOSIS — Y999 Unspecified external cause status: Secondary | ICD-10-CM | POA: Diagnosis not present

## 2018-11-17 DIAGNOSIS — R079 Chest pain, unspecified: Secondary | ICD-10-CM | POA: Insufficient documentation

## 2018-11-17 DIAGNOSIS — Z79899 Other long term (current) drug therapy: Secondary | ICD-10-CM | POA: Diagnosis not present

## 2018-11-17 DIAGNOSIS — Y929 Unspecified place or not applicable: Secondary | ICD-10-CM | POA: Diagnosis not present

## 2018-11-17 DIAGNOSIS — Q799 Congenital malformation of musculoskeletal system, unspecified: Secondary | ICD-10-CM | POA: Insufficient documentation

## 2018-11-17 DIAGNOSIS — X58XXXA Exposure to other specified factors, initial encounter: Secondary | ICD-10-CM | POA: Diagnosis not present

## 2018-11-17 DIAGNOSIS — Y939 Activity, unspecified: Secondary | ICD-10-CM | POA: Diagnosis not present

## 2018-11-17 DIAGNOSIS — S46812A Strain of other muscles, fascia and tendons at shoulder and upper arm level, left arm, initial encounter: Secondary | ICD-10-CM

## 2018-11-17 DIAGNOSIS — S4992XA Unspecified injury of left shoulder and upper arm, initial encounter: Secondary | ICD-10-CM | POA: Diagnosis present

## 2018-11-17 MED ORDER — SODIUM CHLORIDE 0.9% FLUSH
3.0000 mL | Freq: Once | INTRAVENOUS | Status: DC
  Filled 2018-11-17: qty 3

## 2018-11-17 NOTE — ED Notes (Signed)
CP appears more musculoskeletal/neuro based. Originates in left cervical neck, radiates into left shoulder and left chest. Some numbness and tingling in left arm. Movement causes pain in neck. Pt unable to perform ADLs due to pain.

## 2018-11-17 NOTE — ED Triage Notes (Signed)
Per family pt is having severe left arm, shoulder and chest pain for the past 3 days. Pt was seen last Saturday here not getting better with Ibuprofen and Tylenol.

## 2018-11-18 MED ORDER — ACETAMINOPHEN 500 MG PO TABS
1000.0000 mg | ORAL_TABLET | Freq: Once | ORAL | Status: AC
  Administered 2018-11-18: 1000 mg via ORAL
  Filled 2018-11-18: qty 2

## 2018-11-18 MED ORDER — OXYCODONE HCL 5 MG PO TABS
5.0000 mg | ORAL_TABLET | Freq: Once | ORAL | Status: AC
  Administered 2018-11-18: 5 mg via ORAL
  Filled 2018-11-18: qty 1

## 2018-11-18 MED ORDER — DIAZEPAM 5 MG PO TABS
5.0000 mg | ORAL_TABLET | Freq: Once | ORAL | Status: AC
  Administered 2018-11-18: 5 mg via ORAL
  Filled 2018-11-18: qty 1

## 2018-11-18 MED ORDER — DIAZEPAM 5 MG PO TABS: 5.0000 mg | ORAL_TABLET | Freq: Four times a day (QID) | ORAL | 0 refills | Status: DC | PRN

## 2018-11-18 MED ORDER — KETOROLAC TROMETHAMINE 15 MG/ML IJ SOLN
15.0000 mg | Freq: Once | INTRAMUSCULAR | Status: AC
  Administered 2018-11-18: 15 mg via INTRAMUSCULAR
  Filled 2018-11-18: qty 1

## 2018-11-18 NOTE — ED Provider Notes (Addendum)
[facility] EMERGENCY DEPARTMENT Provider Note   CSN: [id number] Arrival date & time: 11/17/18  2204    History   Chief Complaint Chief Complaint  Patient presents with  . Arm Pain  . Chest Pain    HPI Jessica Fleming is a 53 y.o. female.     53 yo F with a chief complaint of left-sided upper back pain.  This been going on for the past 3 days now.  Was seen in the ED at the onset of symptoms has been taking NSAIDs without significant improvement.  Worse with twisting of her head or moving of her left arm.  She feels that the pain is sharp and shoots down her arm to the level of the elbow.  Denies weakness or numbness to the arm.  Has some worsening with deep breathing.  Denies trauma.  The history is provided by the patient and a relative.  Illness  Severity:  Moderate Onset quality:  Gradual Duration:  3 days Timing:  Constant Progression:  Worsening Chronicity:  New Associated symptoms: myalgias   Associated symptoms: no chest pain, no congestion, no fever, no headaches, no nausea, no rhinorrhea, no shortness of breath, no vomiting and no wheezing     Past Medical History:  Diagnosis Date  . GERD (gastroesophageal reflux disease)     Patient Active Problem List   Diagnosis Date Noted  . Left knee pain 09/20/2016  . 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 Morbid obesity due to excess calories (HCC) 10/25/2014  . Allergic rhinitis 04/12/2014  . B12 deficiency 04/12/2014  . Presbyopia 07/06/2013  . 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 Hypercholesterolemia 02/21/2013  . 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 No pertinent surgical history.   OB History   No obstetric history on file.      Home Medications    Prior to Admission medications   Medication Sig Start Date End Date Taking?  Authorizing Provider  benzonatate (TESSALON) 100 MG capsule Take 1 capsule (100 mg total) by mouth every 8 (eight) hours. 02/11/18   Rolan Bucco, MD  cephALEXin (KEFLEX) 500 MG capsule Take 1 capsule (500 mg total) by mouth 4 (four) times daily. 10/26/18   Melene Plan, DO  dextromethorphan-guaiFENesin [facility] DM) 30-600 MG 12hr tablet Take 1 tablet by mouth 2 (two) times daily as needed for cough. 02/11/18   Rolan Bucco, MD  diazepam (VALIUM) 5 MG tablet Take 1 tablet (5 mg total) by mouth every 6 (six) hours as needed for anxiety (spasms). 11/18/18   Melene Plan, DO  guaiFENesin-codeine 100-10 MG/5ML syrup Take 10 mLs by mouth every 6 (six) hours as needed for cough. 09/16/18   Geoffery Lyons, MD  meclizine (ANTIVERT) 25 MG tablet Take 1 tablet (25 mg total) by mouth 3 (three) times daily as needed for dizziness. 08/19/17   Lavera Guise, MD  NAPROXEN PO Take by mouth.    [provider]  oxyCODONE-acetaminophen (PERCOCET) 5-325 MG tablet Take 1 tablet by mouth every 6 (six) hours as needed. 05/22/17   Little, Ambrose Finland, MD  phenazopyridine (PYRIDIUM) 200 MG tablet Take 1 tablet (200 mg total) by mouth 3 (three) times daily as needed for pain. 10/26/18   Melene Plan, DO  promethazine (PHENERGAN) 25 MG tablet Take 1 tablet (25 mg total) by mouth every 6 (six) hours as needed for  nausea or vomiting. 05/22/17   Little, Ambrose Finland, MD  promethazine (PHENERGAN) 25 MG tablet Take 1 tablet (25 mg total) by mouth every 6 (six) hours as needed for nausea or vomiting. 08/19/17   Lavera Guise, MD  tiZANidine (ZANAFLEX) 4 MG capsule 1/2 - 1 tab by mouth three times a day as needed for muscle spasms    [provider]  traMADol (ULTRAM) 50 MG tablet Take 1 tablet (50 mg total) by mouth every 6 (six) hours as needed. 09/07/16   Charlynne Pander, MD  traMADol (ULTRAM) 50 MG tablet Take 1 tablet (50 mg total) by mouth every 6 (six) hours as needed. 06/25/17   Vanetta Mulders, MD    Family  History No family history on file.  Social History Social History   Tobacco Use  . Smoking status: Never Smoker  . Smokeless tobacco: Never Used  Substance Use Topics  . Alcohol use: No  . Drug use: No     Allergies   Fish oil; Hydrocodone-homatropine; and Prednisone   Review of Systems Review of Systems  Constitutional: Negative for chills and fever.  HENT: Negative for congestion and rhinorrhea.   Eyes: Negative for redness and visual disturbance.  Respiratory: Negative for shortness of breath and wheezing.   Cardiovascular: Negative for chest pain and palpitations.  Gastrointestinal: Negative for nausea and vomiting.  Genitourinary: Negative for dysuria and urgency.  Musculoskeletal: Positive for arthralgias and myalgias.  Skin: Negative for pallor and wound.  Neurological: Negative for dizziness and headaches.     Physical Exam Updated Vital Signs BP 126/72   Pulse 80   Temp 97.8 F (36.6 C) (Oral)   Resp 18   Ht  (1.575 m)   Wt 86.2 kg   SpO2 99%   BMI 34.75 kg/m   Physical Exam Vitals signs and nursing note reviewed.  Constitutional:      General: She is not in acute distress.    Appearance: She is well-developed. She is not diaphoretic.  HENT:     Head: Normocephalic and atraumatic.  Eyes:     Pupils: Pupils are equal, round, and reactive to light.  Neck:     Musculoskeletal: Normal range of motion and neck supple.  Cardiovascular:     Rate and Rhythm: Normal rate and regular rhythm.     Heart sounds: No murmur. No friction rub. No gallop.   Pulmonary:     Effort: Pulmonary effort is normal.     Breath sounds: No wheezing or rales.  Abdominal:     General: There is no distension.     Palpations: Abdomen is soft.     Tenderness: There is no abdominal tenderness.  Musculoskeletal:     Left lower leg: She exhibits tenderness.     Comments: Exquisitely tender to palpation of the left trapezius muscle belly.  Reproduces the patient's pain  and reproduces the radiation into the arm.  She has full range of motion of the left shoulder though has pain, pulse motor and sensation is intact to the left upper extremity.  No appreciable weakness.  Skin:    General: Skin is warm and dry.  Neurological:     Mental Status: She is alert and oriented to person, place, and time.  Psychiatric:        Behavior: Behavior normal.      ED Treatments / Results  Labs (all labs ordered are listed, but only abnormal results are displayed) Labs Reviewed - No data  to display  EKG None ED ECG REPORT   Date: 11/18/2018  Rate: 68  Rhythm: normal sinus rhythm  QRS Axis: normal  Intervals: normal  ST/T Wave abnormalities: normal  Conduction Disutrbances:none  Narrative Interpretation:   Old EKG Reviewed: unchanged  I have personally reviewed the EKG tracing and agree with the computerized printout as noted.  Radiology Dg Chest 2 View  Result Date: 11/17/2018 CLINICAL DATA:  Chest pain EXAM: CHEST - 2 VIEW COMPARISON:  02/11/2018 FINDINGS: The heart size and mediastinal contours are within normal limits. Both lungs are clear. The visualized skeletal structures are unremarkable. IMPRESSION: No active cardiopulmonary disease. Electronically Signed   By: Jasmine Pang M.D.   On: 11/17/2018 22:39    Procedures Procedures (including critical care time)  Medications Ordered in ED Medications  acetaminophen (TYLENOL) tablet 1,000 mg (1,000 mg Oral Given 11/18/18 0023)  ketorolac (TORADOL) 15 MG/ML injection 15 mg (15 mg Intramuscular Given 11/18/18 0025)  oxyCODONE (Oxy IR/ROXICODONE) immediate release tablet 5 mg (5 mg Oral Given 11/18/18 0024)  diazepam (VALIUM) tablet 5 mg (5 mg Oral Given 11/18/18 0024)     Initial Impression / Assessment and Plan / ED Course  I have reviewed the triage vital signs and the nursing notes.  Pertinent labs & imaging results that were available during my care of the patient were reviewed by me and considered in  my medical decision making (see chart for details).        52 yo F with a chief complaint of left-sided upper back pain.  Most likely the patient has a trapezius spasm, her pain is reproduced with palpation of the trapezius.  I doubt that this is a vertebral artery dissection, I doubt she has significant spinal pathology as she is able to rotate her head without significant tenderness.  She has no neurologic deficit.  Seems unlikely to be pulmonary embolism or an MI.  She had a chest x-ray and EKG ordered in triage x-ray viewed by me without focal infiltrate or pneumothorax.  Will place in a sling.  She had called a orthopedics office in [place] but needed a referral, I will give her one I also suggested seeing her own sports medicine physician who is in the office upstairs and gave her the information.  We will give her a prescription for a short course of muscle relaxant.   1:13 AM:  I have discussed the diagnosis/risks/treatment options with the patient and family and believe the pt to be eligible for discharge home to follow-up with PCP, Sports med. We also discussed returning to the ED immediately if new or worsening sx occur. We discussed the sx which are most concerning (e.g., sudden worsening pain, fever, inability to tolerate by mouth) that necessitate immediate return. Medications administered to the patient during their visit and any new prescriptions provided to the patient are listed below.  Medications given during this visit Medications  acetaminophen (TYLENOL) tablet 1,000 mg (1,000 mg Oral Given 11/18/18 0023)  ketorolac (TORADOL) 15 MG/ML injection 15 mg (15 mg Intramuscular Given 11/18/18 0025)  oxyCODONE (Oxy IR/ROXICODONE) immediate release tablet 5 mg (5 mg Oral Given 11/18/18 0024)  diazepam (VALIUM) tablet 5 mg (5 mg Oral Given 11/18/18 0024)     The patient appears reasonably screen and/or stabilized for discharge and I doubt any other medical condition or other [facility]  requiring further screening, evaluation, or treatment in the ED at this time prior to discharge.   Final Clinical Impressions(s) / ED Diagnoses  Final diagnoses:  Trapezius strain, left, initial encounter    ED Discharge Orders         Ordered    diazepam (VALIUM) 5 MG tablet  Every 6 hours PRN     11/18/18 0030           Melene Plan, DO 11/18/18 0113    Melene Plan, DO 11/18/18 1610

## 2018-11-18 NOTE — Discharge Instructions (Signed)
Take 4 over the counter ibuprofen tablets 3 times a day or 2 over-the-counter naproxen tablets twice a day for pain. Also take tylenol 1000mg (2 extra strength) four times a day.   Then take the muscle relaxant medicine if you feel like you need it.  This can make her sleepy more likely to fall, she should not drive with this medicine.  She should wear the sling as needed for comfort, she does need to take her arm out of the sling and do range of motion exercises with that shoulder at least 4 times a day.

## 2018-11-20 ENCOUNTER — Encounter: Payer: Self-pay | Admitting: Sports Medicine

## 2018-11-20 ENCOUNTER — Ambulatory Visit (INDEPENDENT_AMBULATORY_CARE_PROVIDER_SITE_OTHER): Payer: Self-pay | Admitting: Sports Medicine

## 2018-11-20 ENCOUNTER — Ambulatory Visit
Admission: RE | Admit: 2018-11-20 | Discharge: 2018-11-20 | Disposition: A | Discharge status: Home / Self Care | Payer: Self-pay | Source: Ambulatory Visit | Attending: Sports Medicine | Admitting: Sports Medicine

## 2018-11-20 VITALS — BP 128/90 | Ht 61.0 in | Wt 200.0 lb

## 2018-11-20 DIAGNOSIS — S46812A Strain of other muscles, fascia and tendons at shoulder and upper arm level, left arm, initial encounter: Secondary | ICD-10-CM

## 2018-11-20 NOTE — Patient Instructions (Signed)
You have a strain of the trapezius muscle. Use ibuprofen or Aleve as needed for pain You may also use Tylenol Continue to use heat times per day Icy hot or Biofreeze may be helpful as well Because the pain radiates into part of the arm, we will obtain x-rays of your cervical spine.  I will call you if there are any concerning findings on the x-rays Do the home stretching exercises that you were taught today. Physical therapy referral placed today. Follow-up in 4 weeks if needed

## 2018-11-20 NOTE — Progress Notes (Signed)
  Jessica Fleming - 53 y.o. female MRN [id number]  Date of birth: 1966-04-21    SUBJECTIVE:      Chief Complaint: Left trapezius pain  HPI:  53 y/o female with left trapezius strain x1 week.  She denies any injury.  She works at [employer].  She had to work the day of onset.  Later that night, she reports waking up with sharp pain in the left upper shoulder area.  She was seen at the emergency room twice.  The first visit was on 11/16/2018 at which time she was treated with injection of Toradol 50 mg.  She subsequently returned on 11/17/2018 and was treated with Tylenol 1000 mg, Toradol 15 mg, oxycodone 5 mg, and Valium 5 mg.  The medication for helpful for a few hours per pain returned.  Chest x-ray was also obtained at that time.  Today, patient reports that her pain has persisted.  It radiates up into the neck out to the shoulder.  It increases with palpation of the area.  Is also made worse with side bending and rotation of this cervical spine to the right.  She has not noticed relief with any medications.  She does note some benefit with heat.  She does feel it is difficult to move her shoulder due to the pain.  Additionally, she does describe some occasional tingling in the fingers of the left hand however is unable to really localize this.   ROS:     See HPI  PERTINENT  PMH / PSH FH / / SH:  Past Medical, Surgical, Social, and Family History Reviewed & Updated in the EMR.    OBJECTIVE: BP 128/90   Ht 5\' 1"  (1.549 m)   Wt 200 lb (90.7 kg)   BMI 37.79 kg/m   Physical Exam:  Vital signs are reviewed.  GEN: Alert and oriented, NAD Pulm: Breathing unlabored PSY: normal mood, congruent affect  MSK: Left shoulder: No obvious deformity or asymmetry. No bruising. No swelling No tenderness over the [facility] joint.  Patient has significant tenderness over the superior portion of the trapezius.  Mild tenderness over the left paraspinal muscles.  No midline spinous process tenderness Patient is able to  achieve full range of motion of the shoulder, however this is guarded due to pain NV intact distally Special Tests:  - Impingement: Neg Hawkins and Neers.  - Supraspinatus: Pain in the trapezius with empty can.  - Infraspinatus/Teres: 5/5 strength with ER - Subscapularis: . 5/5 strength with IR  Right shoulder: No tenderness over the trapezius No obvious deformity Full range of motion with 5/5 strength on rotator cuff testing N/V intact distally  Cervical spine: She is limited in range of motion with rotation to the right.  She describes tightness and pulling in the trapezius with this motion.  She also feels tightness with flexion NV intact in bilateral upper extremities 5/5 strength distally in the hand and wrist   ASSESSMENT & PLAN:  1.  Muscle spasm of the left trapezius -Ibuprofen or Aleve as needed -Tylenol as needed -Continue heat -Recommend trial of icy hot or Biofreeze which may be helpful - Because she also has some nonspecific occasional tingling in the fingers, will obtain x-ray of the cervical spine. -We will refer to physical therapy.  Additionally, she was instructed on gentle home stretching exercises -Follow-up in 4 weeks as needed  I was the preceptor for this visit and available for immediate consultation Marsa Aris, DO

## 2018-11-21 ENCOUNTER — Encounter: Payer: Self-pay | Admitting: Sports Medicine

## 2018-12-11 ENCOUNTER — Ambulatory Visit (INDEPENDENT_AMBULATORY_CARE_PROVIDER_SITE_OTHER): Payer: Self-pay | Admitting: Sports Medicine

## 2018-12-11 ENCOUNTER — Ambulatory Visit: Payer: Medicaid Other | Admitting: Sports Medicine

## 2018-12-11 ENCOUNTER — Other Ambulatory Visit: Payer: Self-pay

## 2018-12-11 DIAGNOSIS — Z5321 Procedure and treatment not carried out due to patient leaving prior to being seen by health care provider: Secondary | ICD-10-CM

## 2018-12-11 NOTE — Progress Notes (Signed)
Scheduled virtual appointment - LWOBS  Initial attempt to call patient: 0904, [id number] - no answer, no voice mail 2nd attempt to call patient: 0920, [id number] - no answer, no voice mail  Additional attempts to contact were made by front desk staff without success.

## 2019-02-27 ENCOUNTER — Emergency Department (HOSPITAL_BASED_OUTPATIENT_CLINIC_OR_DEPARTMENT_OTHER): Payer: Medicaid Other

## 2019-02-27 ENCOUNTER — Other Ambulatory Visit: Payer: Self-pay

## 2019-02-27 ENCOUNTER — Emergency Department (HOSPITAL_BASED_OUTPATIENT_CLINIC_OR_DEPARTMENT_OTHER)
Admission: EM | Admit: 2019-02-27 | Discharge: 2019-02-27 | Disposition: A | Discharge status: Home / Self Care | Payer: Medicaid Other | Attending: Emergency Medicine | Admitting: Emergency Medicine

## 2019-02-27 ENCOUNTER — Encounter (HOSPITAL_BASED_OUTPATIENT_CLINIC_OR_DEPARTMENT_OTHER): Payer: Self-pay | Admitting: *Deleted

## 2019-02-27 DIAGNOSIS — Y929 Unspecified place or not applicable: Secondary | ICD-10-CM | POA: Insufficient documentation

## 2019-02-27 DIAGNOSIS — Y999 Unspecified external cause status: Secondary | ICD-10-CM | POA: Insufficient documentation

## 2019-02-27 DIAGNOSIS — S161XXA Strain of muscle, fascia and tendon at neck level, initial encounter: Secondary | ICD-10-CM

## 2019-02-27 DIAGNOSIS — R51 Headache: Secondary | ICD-10-CM | POA: Insufficient documentation

## 2019-02-27 DIAGNOSIS — X58XXXA Exposure to other specified factors, initial encounter: Secondary | ICD-10-CM | POA: Insufficient documentation

## 2019-02-27 DIAGNOSIS — Y939 Activity, unspecified: Secondary | ICD-10-CM | POA: Insufficient documentation

## 2019-02-27 DIAGNOSIS — Z79899 Other long term (current) drug therapy: Secondary | ICD-10-CM | POA: Insufficient documentation

## 2019-02-27 MED ORDER — DICLOFENAC SODIUM 1 % TD GEL: 2.0000 g | Freq: Four times a day (QID) | TRANSDERMAL | 0 refills | Status: DC

## 2019-02-27 MED ORDER — DEXAMETHASONE SODIUM PHOSPHATE 10 MG/ML IJ SOLN
10.0000 mg | Freq: Once | INTRAMUSCULAR | Status: AC
  Administered 2019-02-27: 10 mg via INTRAMUSCULAR
  Filled 2019-02-27: qty 1

## 2019-02-27 MED ORDER — FENTANYL CITRATE (PF) 100 MCG/2ML IJ SOLN
50.0000 ug | Freq: Once | INTRAMUSCULAR | Status: AC
  Administered 2019-02-27: 12:00:00 50 ug via INTRAVENOUS
  Filled 2019-02-27: qty 2

## 2019-02-27 MED ORDER — OMEPRAZOLE 20 MG PO CPDR: 20.0000 mg | DELAYED_RELEASE_CAPSULE | Freq: Every day | ORAL | 0 refills | Status: DC

## 2019-02-27 NOTE — ED Provider Notes (Signed)
[facility] EMERGENCY DEPARTMENT Provider Note   CSN: [id number] Arrival date & time: 02/27/19  1038     History   Chief Complaint Chief Complaint  Patient presents with  . Head pain    HPI Jessica Fleming is a 53 y.o. female.     Patient is a 53 year old female who presents with head and neck pain.  She states that about 2 weeks ago she started having pain in her left neck.  It radiates up to her head and she now has a headache on the left side of her head and around her ear.  She also says it radiates to the top of her shoulder.  There is no radiation down her arm.  No numbness or weakness in her hands.  She says it is markedly worse when she turns her head to the left.  She denies any known injuries.  She denies any prior similar symptoms.  She has been using Naprosyn and ibuprofen with some improvement in symptoms.     Past Medical History:  Diagnosis Date  . GERD (gastroesophageal reflux disease)     Patient Active Problem List   Diagnosis Date Noted  . Left knee pain 09/20/2016  . 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 Morbid obesity due to excess calories ([place]) 10/25/2014  . Allergic rhinitis 04/12/2014  . B12 deficiency 04/12/2014  . Presbyopia 07/06/2013  . 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 Hypercholesterolemia 02/21/2013  . 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 No pertinent surgical history.   OB History    Gravida  4   Para  4   Term      Preterm      AB      Living        SAB      TAB      Ectopic      Multiple      Live Births               Home Medications    Prior to Admission medications   Medication Sig Start Date End Date Taking? Authorizing Provider  NAPROXEN PO Take by mouth.   Yes [provider]  tiZANidine (ZANAFLEX) 4 MG capsule  1/2 - 1 tab by mouth three times a day as needed for muscle spasms   Yes [provider]  cephALEXin (KEFLEX) 500 MG capsule Take 1 capsule (500 mg total) by mouth 4 (four) times daily. 10/26/18   Deno Etienne, DO  diazepam (VALIUM) 5 MG tablet Take 1 tablet (5 mg total) by mouth every 6 (six) hours as needed for anxiety (spasms). 11/18/18   Deno Etienne, DO  diclofenac sodium (VOLTAREN) 1 % GEL Apply 2 g topically 4 (four) times daily. 02/27/19   Malvin Johns, MD  omeprazole (PRILOSEC) 20 MG capsule Take 1 capsule (20 mg total) by mouth daily. 02/27/19   Malvin Johns, MD    Family History Family History  Problem Relation Age of Onset  . Diabetes Father     Social History Social History   Tobacco Use  . Smoking status: Never Smoker  . Smokeless tobacco: Never Used  Substance Use Topics  . Alcohol use: No  . Drug use: No     Allergies   Fish oil, Hydrocodone-homatropine, and Prednisone   Review of Systems Review of Systems  Constitutional:  Negative for chills, diaphoresis, fatigue and fever.  HENT: Negative for congestion, rhinorrhea and sneezing.   Eyes: Negative.   Respiratory: Negative for cough, chest tightness and shortness of breath.   Cardiovascular: Negative for chest pain and leg swelling.  Gastrointestinal: Negative for abdominal pain, blood in stool, diarrhea, nausea and vomiting.  Genitourinary: Negative for difficulty urinating, flank pain, frequency and hematuria.  Musculoskeletal: Positive for neck pain. Negative for arthralgias and back pain.  Skin: Negative for rash.  Neurological: Positive for headaches. Negative for dizziness, speech difficulty, weakness and numbness.     Physical Exam Updated Vital Signs BP 128/75 (BP Location: Left Arm)   Pulse 62   Temp 98 F (36.7 C) (Oral)   Resp 16   Ht 5\' 1"  (1.549 m)   Wt 94.7 kg   SpO2 99%   BMI 39.45 kg/m   Physical Exam Constitutional:      Appearance: She is well-developed.  HENT:     Head:  Normocephalic and atraumatic.  Eyes:     Pupils: Pupils are equal, round, and reactive to light.  Neck:     Comments: Patient has tenderness to the left cervical area and the paraspinal muscles.  She has no pain along the bony spine. Cardiovascular:     Rate and Rhythm: Normal rate and regular rhythm.     Heart sounds: Normal heart sounds.  Pulmonary:     Effort: Pulmonary effort is normal. No respiratory distress.     Breath sounds: Normal breath sounds. No wheezing or rales.  Chest:     Chest wall: No tenderness.  Abdominal:     General: Bowel sounds are normal.     Palpations: Abdomen is soft.     Tenderness: There is no abdominal tenderness. There is no guarding or rebound.  Musculoskeletal: Normal range of motion.  Lymphadenopathy:     Cervical: No cervical adenopathy.  Skin:    General: Skin is warm and dry.     Findings: No rash.  Neurological:     Mental Status: She is alert and oriented to person, place, and time.     Comments: Motor 5 out of 5 all extremities, sensation grossly intact light touch all extremities, cranial nerves II through XII grossly intact      ED Treatments / Results  Labs (all labs ordered are listed, but only abnormal results are displayed) Labs Reviewed - No data to display  EKG    Radiology Ct Head Wo Contrast  Result Date: 02/27/2019 CLINICAL DATA:  Left-sided headache with pain radiating to the left neck and shoulder for the past 2 weeks. EXAM: CT HEAD WITHOUT CONTRAST TECHNIQUE: Contiguous axial images were obtained from the base of the skull through the vertex without intravenous contrast. COMPARISON:  CT head dated March 11, 2013. FINDINGS: Brain: No evidence of acute infarction, hemorrhage, hydrocephalus, extra-axial collection or mass lesion/mass effect. Vascular: No hyperdense vessel or unexpected calcification. Skull: Normal. Negative for fracture or focal lesion. Sinuses/Orbits: No acute finding. Other: None. IMPRESSION: 1.  No acute  intracranial abnormality. Electronically Signed   By: Obie DredgeWilliam T Derry M.D.   On: 02/27/2019 12:35    Procedures Procedures (including critical care time)  Medications Ordered in ED Medications  fentaNYL (SUBLIMAZE) injection 50 mcg (50 mcg Intravenous Given 02/27/19 1155)  dexamethasone (DECADRON) injection 10 mg (10 mg Intramuscular Given 02/27/19 1417)     Initial Impression / Assessment and Plan / ED Course  I have reviewed the triage vital signs and the  nursing notes.  Pertinent labs & imaging results that were available during my care of the patient were reviewed by me and considered in my medical decision making (see chart for details).        Patient presents with left-sided neck pain and left-sided headache.  Given the worsening headache, I did do a CT scan of her head which shows no acute abnormality.  She has no neurologic deficits.  No numbness to her face.  No vision changes.  No speech deficits.  No motor or sensory changes to her extremities.  Her pain has markedly improved with treatment in the ED.  I feel this is likely musculoskeletal in nature and could represent a pinched nerve.  She was discharged home in good condition.  She was given a shot of Decadron.  She has had a prior problem with steroid use giving her stomach upset.  This was when she was on multiple doses of prednisone.  She has not been on steroids for a while.  I did start her on Prilosec and advised her not to use NSAIDs at this point.  She also has a prior allergy to hydrocodone.  We opted to try Voltaren gel.  On chart review, I do see that she was seen for pain in her left trapezius muscle by [facility].  I will refer her back to them for follow-up.  Final Clinical Impressions(s) / ED Diagnoses   Final diagnoses:  Cervical strain, acute, initial encounter    ED Discharge Orders         Ordered    diclofenac sodium (VOLTAREN) 1 % GEL  4 times daily     02/27/19 1421    omeprazole  (PRILOSEC) 20 MG capsule  Daily     02/27/19 1421           Rolan BuccoBelfi, Lessie Funderburke, MD 02/27/19 1427

## 2019-02-27 NOTE — ED Triage Notes (Signed)
Patient stated that she had this pain behind her left ear  that radiates to her left neck, shoulder and deltoid area.  She had this pain for 2 weeks and had taken Ibuprofen and Naproxen without any relief.

## 2019-02-27 NOTE — ED Notes (Signed)
ED Provider at bedside. 

## 2019-03-11 ENCOUNTER — Ambulatory Visit: Payer: Self-pay | Admitting: Family Medicine

## 2019-12-05 ENCOUNTER — Ambulatory Visit: Payer: Self-pay | Admitting: Internal Medicine

## 2020-02-12 ENCOUNTER — Encounter (HOSPITAL_BASED_OUTPATIENT_CLINIC_OR_DEPARTMENT_OTHER): Payer: Self-pay

## 2020-02-12 ENCOUNTER — Other Ambulatory Visit: Payer: Self-pay

## 2020-02-12 DIAGNOSIS — R1013 Epigastric pain: Secondary | ICD-10-CM | POA: Insufficient documentation

## 2020-02-12 NOTE — ED Triage Notes (Signed)
Pt returned from [place] on 5/25. On 5/26 pt started having abd pain, bloating, fatigue, and diarrhea. Denies N/V. Pt had a negative COVID test 48 hrs prior to travel.

## 2020-02-13 ENCOUNTER — Encounter (HOSPITAL_BASED_OUTPATIENT_CLINIC_OR_DEPARTMENT_OTHER): Payer: Self-pay

## 2020-02-13 ENCOUNTER — Emergency Department (HOSPITAL_BASED_OUTPATIENT_CLINIC_OR_DEPARTMENT_OTHER)
Admission: EM | Admit: 2020-02-13 | Discharge: 2020-02-13 | Disposition: A | Discharge status: Home / Self Care | Payer: Self-pay | Attending: Emergency Medicine | Admitting: Emergency Medicine

## 2020-02-13 ENCOUNTER — Emergency Department (HOSPITAL_BASED_OUTPATIENT_CLINIC_OR_DEPARTMENT_OTHER): Payer: Self-pay

## 2020-02-13 DIAGNOSIS — R1013 Epigastric pain: Secondary | ICD-10-CM

## 2020-02-13 LAB — URINALYSIS, ROUTINE W REFLEX MICROSCOPIC
Bilirubin Urine: NEGATIVE
Glucose, UA: NEGATIVE mg/dL
Ketones, ur: NEGATIVE mg/dL
Leukocytes,Ua: NEGATIVE
Nitrite: NEGATIVE
Protein, ur: NEGATIVE mg/dL
Specific Gravity, Urine: <1.005 — ABNORMAL LOW (ref 1.005–1.030)
pH: 5 (ref 5.0–8.0)

## 2020-02-13 LAB — URINALYSIS, MICROSCOPIC (REFLEX)

## 2020-02-13 LAB — CBC WITH DIFFERENTIAL/PLATELET
Abs Immature Granulocytes: 0.02 10*3/uL (ref 0.00–0.07)
Basophils Absolute: 0 10*3/uL (ref 0.0–0.1)
Basophils Relative: 1 %
Eosinophils Absolute: 0.2 10*3/uL (ref 0.0–0.5)
Eosinophils Relative: 3 %
HCT: 36.8 % (ref 36.0–46.0)
Hemoglobin: 12.1 g/dL (ref 12.0–15.0)
Immature Granulocytes: 0 %
Lymphocytes Relative: 24 %
Lymphs Abs: 1.7 10*3/uL (ref 0.7–4.0)
MCH: 28.7 pg (ref 26.0–34.0)
MCHC: 32.9 g/dL (ref 30.0–36.0)
MCV: 87.2 fL (ref 80.0–100.0)
Monocytes Absolute: 0.5 10*3/uL (ref 0.1–1.0)
Monocytes Relative: 7 %
Neutro Abs: 4.6 10*3/uL (ref 1.7–7.7)
Neutrophils Relative %: 65 %
Platelets: 271 10*3/uL (ref 150–400)
RBC: 4.22 MIL/uL (ref 3.87–5.11)
RDW: 13.6 % (ref 11.5–15.5)
WBC: 7 10*3/uL (ref 4.0–10.5)
nRBC: 0 % (ref 0.0–0.2)

## 2020-02-13 LAB — COMPREHENSIVE METABOLIC PANEL
ALT: 46 U/L — ABNORMAL HIGH (ref 0–44)
AST: 37 U/L (ref 15–41)
Albumin: 3.9 g/dL (ref 3.5–5.0)
Alkaline Phosphatase: 80 U/L (ref 38–126)
Anion gap: 10 (ref 5–15)
BUN: 14 mg/dL (ref 6–20)
CO2: 26 mmol/L (ref 22–32)
Calcium: 9.1 mg/dL (ref 8.9–10.3)
Chloride: 102 mmol/L (ref 98–111)
Creatinine, Ser: 0.52 mg/dL (ref 0.44–1.00)
GFR calc Af Amer: >60 mL/min (ref >60)
GFR calc non Af Amer: >60 mL/min (ref >60)
Glucose, Bld: 104 mg/dL — ABNORMAL HIGH (ref 70–99)
Potassium: 4 mmol/L (ref 3.5–5.1)
Sodium: 138 mmol/L (ref 135–145)
Total Bilirubin: 0.4 mg/dL (ref 0.3–1.2)
Total Protein: 8 g/dL (ref 6.5–8.1)

## 2020-02-13 LAB — LIPASE, BLOOD: Lipase: 33 U/L (ref 11–51)

## 2020-02-13 MED ORDER — PANTOPRAZOLE SODIUM 40 MG IV SOLR
40.0000 mg | Freq: Once | INTRAVENOUS | Status: AC
  Administered 2020-02-13: 40 mg via INTRAVENOUS
  Filled 2020-02-13: qty 40

## 2020-02-13 MED ORDER — OMEPRAZOLE 20 MG PO CPDR: 20.0000 mg | DELAYED_RELEASE_CAPSULE | Freq: Every day | ORAL | 0 refills | Status: AC

## 2020-02-13 MED ORDER — IOHEXOL 300 MG/ML  SOLN
100.0000 mL | Freq: Once | INTRAMUSCULAR | Status: AC | PRN
  Administered 2020-02-13: 100 mL via INTRAVENOUS

## 2020-02-13 MED ORDER — ONDANSETRON HCL 4 MG/2ML IJ SOLN
4.0000 mg | Freq: Once | INTRAMUSCULAR | Status: AC
  Administered 2020-02-13: 4 mg via INTRAVENOUS
  Filled 2020-02-13: qty 2

## 2020-02-13 MED ORDER — FENTANYL CITRATE (PF) 100 MCG/2ML IJ SOLN
50.0000 ug | Freq: Once | INTRAMUSCULAR | Status: AC
  Administered 2020-02-13: 50 ug via INTRAVENOUS
  Filled 2020-02-13: qty 2

## 2020-02-13 NOTE — ED Provider Notes (Signed)
[facility] Provider Note: Lowella Dell, MD, FACEP  CSN: [id number] MRN: [id number] ARRIVAL: 02/12/20 at 2243 ROOM: [facility]11/[facility]11   CHIEF COMPLAINT  Abdominal Pain   HISTORY OF PRESENT ILLNESS  02/13/20 2:55 AM Jessica Fleming is a 54 y.o. female who recently traveled to [place] for 4 months, returning 02/09/2020.  The next day she began having epigastric pain which persist.  She rates the pain as an 8 out of 10, worse with palpation.  It has been associated with diarrhea which is not severe nor bloody.  She denies nausea or vomiting.  She has been having difficulty eating due to the sensation of bloating.  She has also felt fatigued.  She has not had a fever that she is aware of.   Past Medical History:  Diagnosis Date  . GERD (gastroesophageal reflux disease)     History reviewed. No pertinent surgical history.  Family History  Problem Relation Age of Onset  . Diabetes Father     Social History   Tobacco Use  . Smoking status: Never Smoker  . Smokeless tobacco: Never Used  Substance Use Topics  . Alcohol use: No  . Drug use: No    Prior to Admission medications   Medication Sig Start Date End Date Taking? Authorizing Provider  omeprazole (PRILOSEC) 20 MG capsule Take 1 capsule (20 mg total) by mouth daily. 02/13/20   Halbert Jesson, MD    Allergies Fish oil, Hydrocodone-homatropine, and Prednisone   REVIEW OF SYSTEMS  Negative except as noted here or in the History of Present Illness.   PHYSICAL EXAMINATION  Initial Vital Signs Blood pressure (!) 142/79, pulse 60, temperature 98.2 F (36.8 C), temperature source Oral, resp. rate 17, height 5\' 1"  (1.549 m), weight 81.6 kg, SpO2 98 %.  Examination General: Well-developed, well-nourished female in no acute distress; appearance consistent with age of record HENT: normocephalic; atraumatic Eyes: pupils equal, round and reactive to light; extraocular muscles intact Neck: supple Heart: regular rate and  rhythm Lungs: clear to auscultation bilaterally Abdomen: soft; nondistended; epigastric tenderness; bowel sounds present Extremities: No deformity; full range of motion; pulses normal Neurologic: Awake, alert and oriented; motor function intact in all extremities and symmetric; no facial droop Skin: Warm and dry Psychiatric: Normal mood and affect   RESULTS  Summary of this visit's results, reviewed and interpreted by myself:   EKG Interpretation  Date/Time:    Ventricular Rate:    PR Interval:    QRS Duration:   QT Interval:    QTC Calculation:   R Axis:     Text Interpretation:        Laboratory Studies: Results for orders placed or performed during the hospital encounter of 02/13/20 (from the past 24 hour(s))  CBC with Differential/Platelet     Status: None   Collection Time: 02/13/20  2:19 AM  Result Value Ref Range   WBC 7.0 4.0 - 10.5 K/uL   RBC 4.22 3.87 - 5.11 MIL/uL   Hemoglobin 12.1 12.0 - 15.0 g/dL   HCT 02/15/20 54.6 - 56.8 %   MCV 87.2 80.0 - 100.0 fL   MCH 28.7 26.0 - 34.0 pg   MCHC 32.9 30.0 - 36.0 g/dL   RDW 12.7 51.7 - 00.1 %   Platelets 271 150 - 400 K/uL   nRBC 0.0 0.0 - 0.2 %   Neutrophils Relative % 65 %   Neutro Abs 4.6 1.7 - 7.7 K/uL   Lymphocytes Relative 24 %   Lymphs Abs  1.7 0.7 - 4.0 K/uL   Monocytes Relative 7 %   Monocytes Absolute 0.5 0.1 - 1.0 K/uL   Eosinophils Relative 3 %   Eosinophils Absolute 0.2 0.0 - 0.5 K/uL   Basophils Relative 1 %   Basophils Absolute 0.0 0.0 - 0.1 K/uL   Immature Granulocytes 0 %   Abs Immature Granulocytes 0.02 0.00 - 0.07 K/uL  Comprehensive metabolic panel     Status: Abnormal   Collection Time: 02/13/20  2:19 AM  Result Value Ref Range   Sodium 138 135 - 145 mmol/L   Potassium 4.0 3.5 - 5.1 mmol/L   Chloride 102 98 - 111 mmol/L   CO2 26 22 - 32 mmol/L   Glucose, Bld 104 (H) 70 - 99 mg/dL   BUN 14 6 - 20 mg/dL   Creatinine, Ser 6.46 0.44 - 1.00 mg/dL   Calcium 9.1 8.9 - 80.3 mg/dL   Total Protein  8.0 6.5 - 8.1 g/dL   Albumin 3.9 3.5 - 5.0 g/dL   AST 37 15 - 41 U/L   ALT 46 (H) 0 - 44 U/L   Alkaline Phosphatase 80 38 - 126 U/L   Total Bilirubin 0.4 0.3 - 1.2 mg/dL   GFR calc non Af Amer >60 >60 mL/min   GFR calc Af Amer >60 >60 mL/min   Anion gap 10 5 - 15  Lipase, blood     Status: None   Collection Time: 02/13/20  2:19 AM  Result Value Ref Range   Lipase 33 11 - 51 U/L  Urinalysis, Routine w reflex microscopic     Status: Abnormal   Collection Time: 02/13/20  3:40 AM  Result Value Ref Range   Color, Urine COLORLESS (A) YELLOW   APPearance CLEAR CLEAR   Specific Gravity, Urine <1.005 (L) 1.005 - 1.030   pH 5.0 5.0 - 8.0   Glucose, UA NEGATIVE NEGATIVE mg/dL   Hgb urine dipstick MODERATE (A) NEGATIVE   Bilirubin Urine NEGATIVE NEGATIVE   Ketones, ur NEGATIVE NEGATIVE mg/dL   Protein, ur NEGATIVE NEGATIVE mg/dL   Nitrite NEGATIVE NEGATIVE   Leukocytes,Ua NEGATIVE NEGATIVE  Urinalysis, Microscopic (reflex)     Status: Abnormal   Collection Time: 02/13/20  3:40 AM  Result Value Ref Range   RBC / HPF 0-5 0 - 5 RBC/hpf   WBC, UA 0-5 0 - 5 WBC/hpf   Bacteria, UA FEW (A) NONE SEEN   Squamous Epithelial / LPF 0-5 0 - 5   Imaging Studies: CT ABDOMEN PELVIS W CONTRAST  Result Date: 02/13/2020 CLINICAL DATA:  Epigastric pain and fatigue EXAM: CT ABDOMEN AND PELVIS WITH CONTRAST TECHNIQUE: Multidetector CT imaging of the abdomen and pelvis was performed using the standard protocol following bolus administration of intravenous contrast. CONTRAST:  OMNIPAQUE IOHEXOL 300 MG/ML  SOLN COMPARISON:  11/19/2017 FINDINGS: LOWER CHEST: Normal. HEPATOBILIARY: Normal hepatic contours. No intra- or extrahepatic biliary dilatation. The gallbladder is normal. PANCREAS: Normal pancreas. No ductal dilatation or peripancreatic fluid collection. SPLEEN: Normal. ADRENALS/URINARY TRACT: The adrenal glands are normal. No hydronephrosis, nephroureterolithiasis or solid renal mass. The urinary bladder  is normal for degree of distention STOMACH/BOWEL: There is no hiatal hernia. Normal duodenal course and caliber. No small bowel dilatation or inflammation. No focal colonic abnormality. Normal appendix. VASCULAR/LYMPHATIC: Normal course and caliber of the major abdominal vessels. No abdominal or pelvic lymphadenopathy. REPRODUCTIVE: Normal uterus and ovaries. MUSCULOSKELETAL. No bony spinal canal stenosis or focal osseous abnormality. OTHER: None. IMPRESSION: No acute abnormality of the  abdomen or pelvis. Electronically Signed   By: Ulyses Jarred M.D.   On: 02/13/2020 04:27    ED COURSE and MDM  Nursing notes, initial and subsequent vitals signs, including pulse oximetry, reviewed and interpreted by myself.  Vitals:   02/12/20 2255 02/12/20 2256 02/13/20 0219 02/13/20 0422  BP: 126/81  (!) 142/79 (!) 148/85  Pulse: 66  60 60  Resp: 20  17 16   Temp: 98.2 F (36.8 C)     TempSrc: Oral     SpO2: 97%  98% 98%  Weight:  81.6 kg    Height:  5\' 1"  (1.549 m)     Medications  pantoprazole (PROTONIX) injection 40 mg (40 mg Intravenous Given 02/13/20 0417)  ondansetron (ZOFRAN) injection 4 mg (4 mg Intravenous Given 02/13/20 0422)  fentaNYL (SUBLIMAZE) injection 50 mcg (50 mcg Intravenous Given 02/13/20 0424)  iohexol (OMNIPAQUE) 300 MG/ML solution 100 mL (100 mLs Intravenous Contrast Given 02/13/20 0351)   6:21 AM Patient denies pain at this time.  Her abdomen is soft and nontender.  She was advised of her reassuring CT scan and reassuring lab work. She states the diarrhea she has been having has been mild and nonbloody.  I do not believe that treatment with antibiotics is indicated at this time.  We will place her on a PPI as her pain is epigastric and could possibly represent gastritis.   PROCEDURES  Procedures   ED DIAGNOSES     ICD-10-CM   1. Epigastric pain  R10.13        Tramain Gershman, Jenny Reichmann, MD 02/13/20 [phone]

## 2020-02-13 NOTE — ED Notes (Signed)
Pt began having sx of central abd pain, tender to touch, with n/d no v. States bloating sensation. Sx began when arriving [place] states [place]. Denies taking meds for intervention. Family member @ bedside to interpret.

## 2020-02-13 NOTE — ED Notes (Signed)
Pt unable to urinate at this time.  

## 2020-02-13 NOTE — ED Notes (Signed)
ED Provider at bedside. 

## 2020-03-06 IMAGING — CT CT HEAD WITHOUT CONTRAST
3 series · 15 of 47 positions shown, 18 images · non-contrast
Comparison: CT head dated March 11, 2013.

CLINICAL DATA: Left-sided headache with pain radiating to the left
neck and shoulder for the past 2 weeks.

EXAM:
CT HEAD WITHOUT CONTRAST
TECHNIQUE: Contiguous axial images were obtained from the base of the skull
through the vertex without intravenous contrast.

[Series 2: head wo · axial · 0.49mm/px · z∈[-166,-36]mm · 9 of 32 slices shown, 12 images]
[im 3/32  brain]
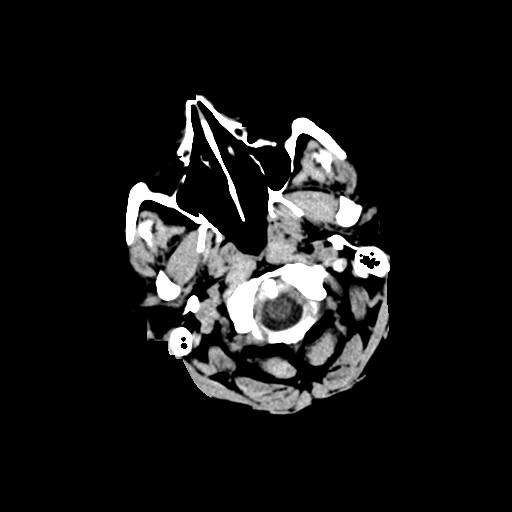
[im 3/32  bone]
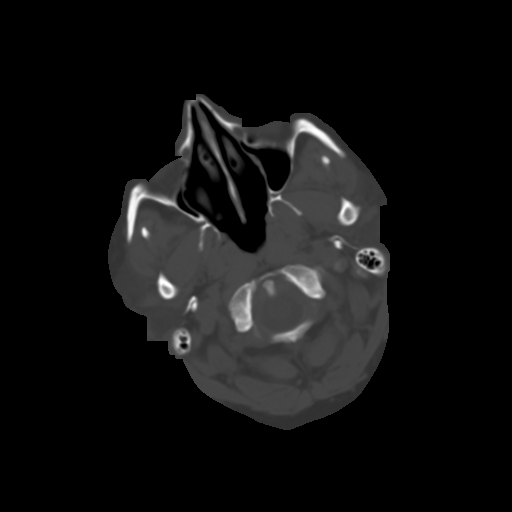
[im 6/32  brain]
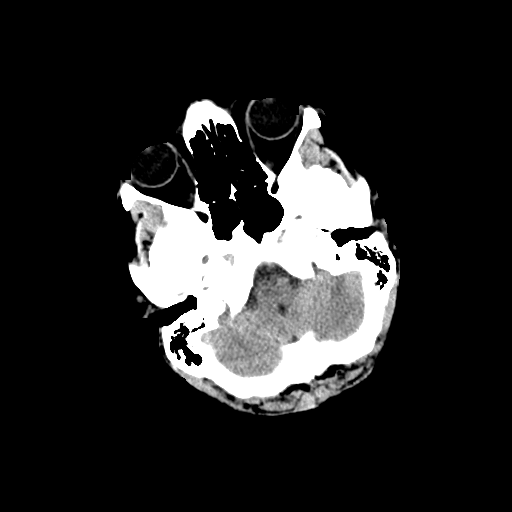
[im 9/32  brain]
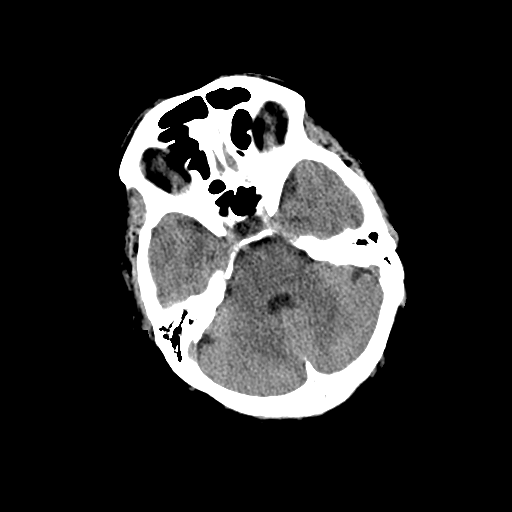
[im 12/32  brain]
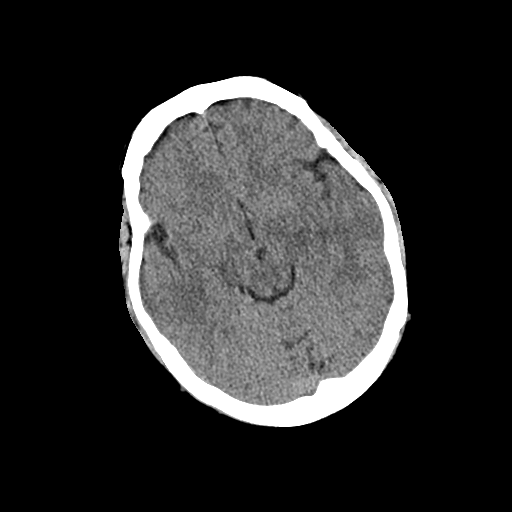
[im 17/32  brain]
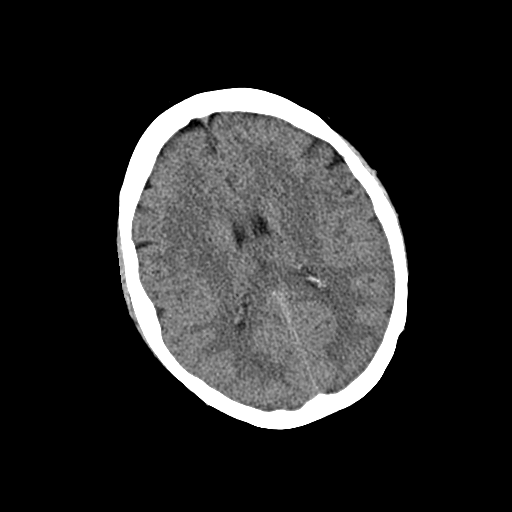
[im 17/32  bone]
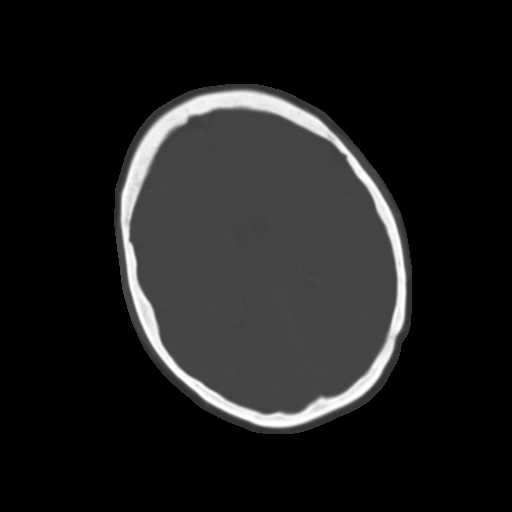
[im 20/32  brain]
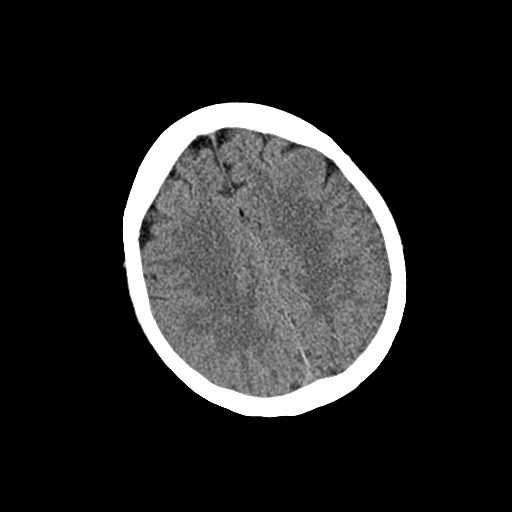
[im 23/32  brain]
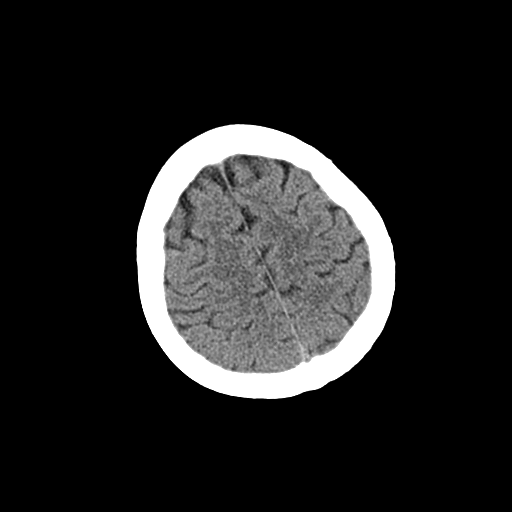
[im 26/32  brain]
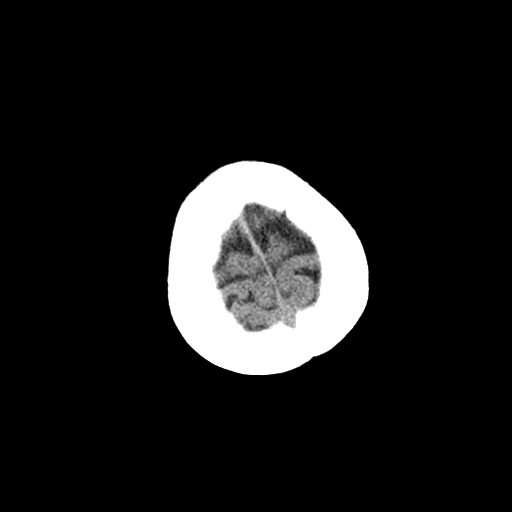
[im 29/32  brain]
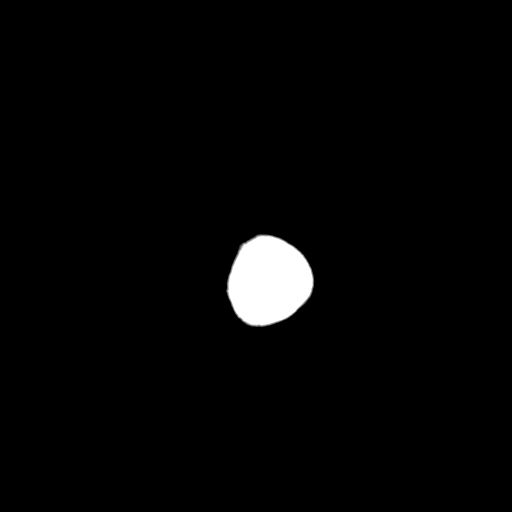
[im 29/32  bone]
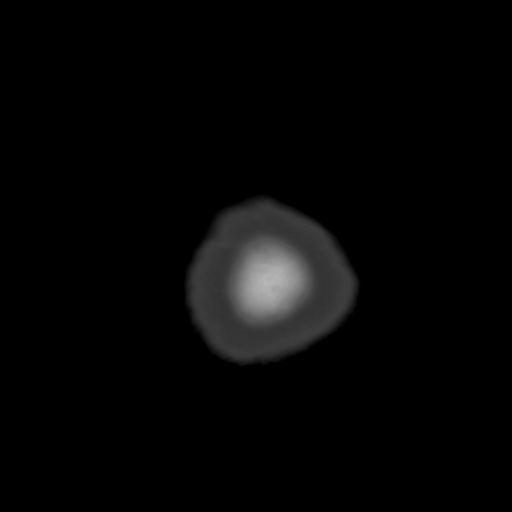

[Series 4: cor soft · coronal · 0.36mm/px · 3 of 84 slices shown]
[im 28/84  brain]
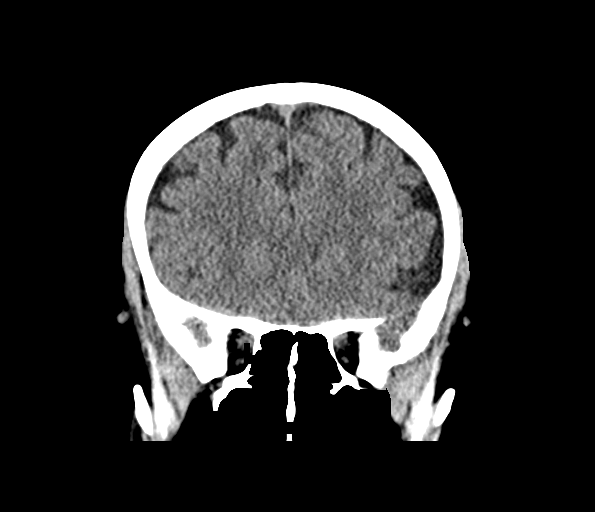
[im 37/84  brain]
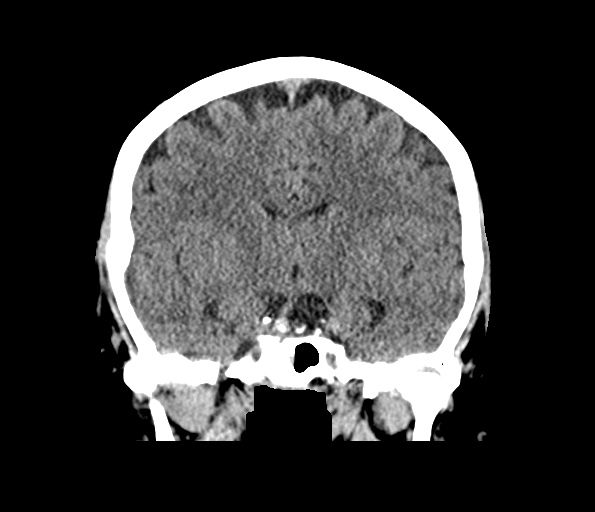
[im 47/84  brain]
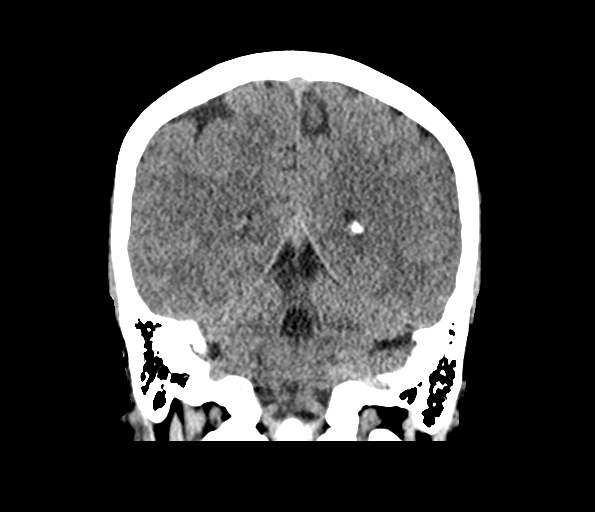

[Series 5: sag soft · sagittal · 0.36mm/px · 3 of 72 slices shown]
[im 24/72  brain]
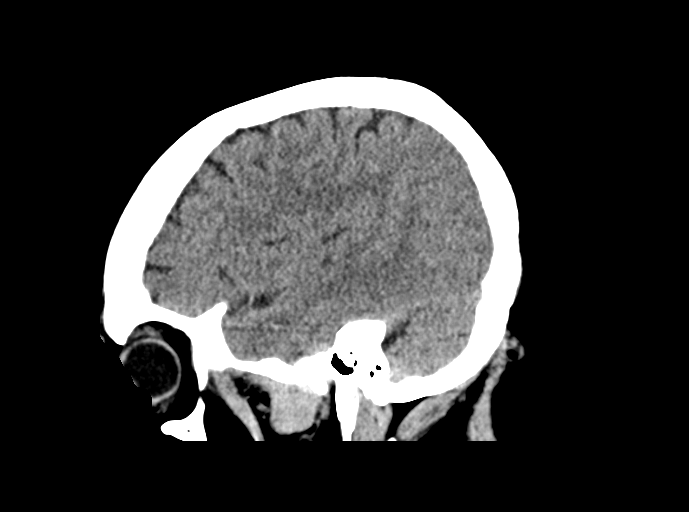
[im 36/72  brain]
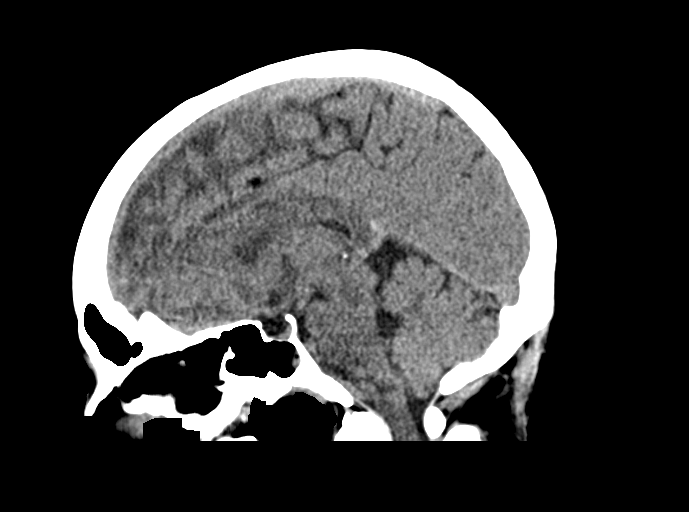
[im 48/72  brain]
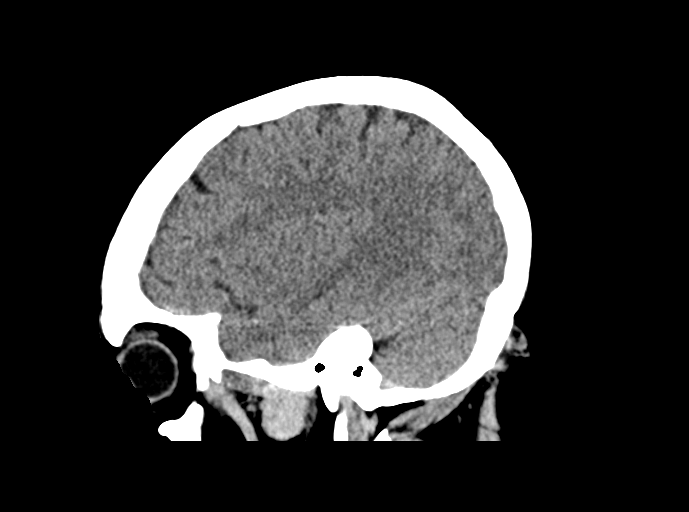

[15 of 47 positions shown; findings below may reference images not displayed]

FINDINGS: Brain: No evidence of acute infarction, hemorrhage, hydrocephalus,
extra-axial collection or mass lesion/mass effect.

Vascular: No hyperdense vessel or unexpected calcification.

Skull: Normal. Negative for fracture or focal lesion.

Sinuses/Orbits: No acute finding.

Other: None.
IMPRESSION: 1.  No acute intracranial abnormality.

## 2020-04-01 ENCOUNTER — Emergency Department (HOSPITAL_BASED_OUTPATIENT_CLINIC_OR_DEPARTMENT_OTHER)
Admission: EM | Admit: 2020-04-01 | Discharge: 2020-04-01 | Disposition: A | Discharge status: Home / Self Care | Payer: Self-pay | Attending: Emergency Medicine | Admitting: Emergency Medicine

## 2020-04-01 ENCOUNTER — Encounter (HOSPITAL_BASED_OUTPATIENT_CLINIC_OR_DEPARTMENT_OTHER): Payer: Self-pay | Admitting: *Deleted

## 2020-04-01 ENCOUNTER — Emergency Department (HOSPITAL_BASED_OUTPATIENT_CLINIC_OR_DEPARTMENT_OTHER): Payer: Self-pay

## 2020-04-01 ENCOUNTER — Other Ambulatory Visit: Payer: Self-pay

## 2020-04-01 DIAGNOSIS — Z20822 Contact with and (suspected) exposure to covid-19: Secondary | ICD-10-CM | POA: Insufficient documentation

## 2020-04-01 DIAGNOSIS — M7918 Myalgia, other site: Secondary | ICD-10-CM | POA: Insufficient documentation

## 2020-04-01 DIAGNOSIS — M791 Myalgia, unspecified site: Secondary | ICD-10-CM

## 2020-04-01 DIAGNOSIS — R5383 Other fatigue: Secondary | ICD-10-CM | POA: Insufficient documentation

## 2020-04-01 LAB — CBC WITH DIFFERENTIAL/PLATELET
Abs Immature Granulocytes: 0.01 10*3/uL (ref 0.00–0.07)
Basophils Absolute: 0 10*3/uL (ref 0.0–0.1)
Basophils Relative: 1 %
Eosinophils Absolute: 0.2 10*3/uL (ref 0.0–0.5)
Eosinophils Relative: 3 %
HCT: 37 % (ref 36.0–46.0)
Hemoglobin: 12.1 g/dL (ref 12.0–15.0)
Immature Granulocytes: 0 %
Lymphocytes Relative: 31 %
Lymphs Abs: 1.8 10*3/uL (ref 0.7–4.0)
MCH: 28.9 pg (ref 26.0–34.0)
MCHC: 32.7 g/dL (ref 30.0–36.0)
MCV: 88.3 fL (ref 80.0–100.0)
Monocytes Absolute: 0.4 10*3/uL (ref 0.1–1.0)
Monocytes Relative: 7 %
Neutro Abs: 3.4 10*3/uL (ref 1.7–7.7)
Neutrophils Relative %: 58 %
Platelets: 242 10*3/uL (ref 150–400)
RBC: 4.19 MIL/uL (ref 3.87–5.11)
RDW: 13.2 % (ref 11.5–15.5)
WBC: 5.9 10*3/uL (ref 4.0–10.5)
nRBC: 0 % (ref 0.0–0.2)

## 2020-04-01 LAB — URINALYSIS, ROUTINE W REFLEX MICROSCOPIC
Bilirubin Urine: NEGATIVE
Glucose, UA: NEGATIVE mg/dL
Ketones, ur: NEGATIVE mg/dL
Leukocytes,Ua: NEGATIVE
Nitrite: NEGATIVE
Protein, ur: NEGATIVE mg/dL
Specific Gravity, Urine: 1.02 (ref 1.005–1.030)
pH: 6.5 (ref 5.0–8.0)

## 2020-04-01 LAB — COMPREHENSIVE METABOLIC PANEL
ALT: 31 U/L (ref 0–44)
AST: 30 U/L (ref 15–41)
Albumin: 3.9 g/dL (ref 3.5–5.0)
Alkaline Phosphatase: 82 U/L (ref 38–126)
Anion gap: 10 (ref 5–15)
BUN: 17 mg/dL (ref 6–20)
CO2: 26 mmol/L (ref 22–32)
Calcium: 9.1 mg/dL (ref 8.9–10.3)
Chloride: 103 mmol/L (ref 98–111)
Creatinine, Ser: 0.65 mg/dL (ref 0.44–1.00)
GFR calc Af Amer: >60 mL/min (ref >60)
GFR calc non Af Amer: >60 mL/min (ref >60)
Glucose, Bld: 101 mg/dL — ABNORMAL HIGH (ref 70–99)
Potassium: 4.4 mmol/L (ref 3.5–5.1)
Sodium: 139 mmol/L (ref 135–145)
Total Bilirubin: 0.2 mg/dL — ABNORMAL LOW (ref 0.3–1.2)
Total Protein: 7.9 g/dL (ref 6.5–8.1)

## 2020-04-01 LAB — LIPASE, BLOOD: Lipase: 34 U/L (ref 11–51)

## 2020-04-01 LAB — URINALYSIS, MICROSCOPIC (REFLEX)

## 2020-04-01 LAB — SARS CORONAVIRUS 2 BY RT PCR (HOSPITAL ORDER, PERFORMED IN ~~LOC~~ HOSPITAL LAB): SARS Coronavirus 2: NEGATIVE

## 2020-04-01 LAB — PREGNANCY, URINE: Preg Test, Ur: NEGATIVE

## 2020-04-01 NOTE — Discharge Instructions (Addendum)
Please follow up with your primary care doctor.  You have a covid test sent.  You can follow up your results with my chart.  You have a urine culture sent to determine if you have a urinary tract infection.  Your chest x-ray was reassuring and your labs were reassuring. If you develop fevers, shortness of breath, new or concerning symptoms, please seek additional medical care and evaluation.

## 2020-04-01 NOTE — ED Triage Notes (Signed)
Pt c/o fatigue  generalized weakness x 2 weeks , denies n/v/d

## 2020-04-01 NOTE — ED Provider Notes (Signed)
[facility] EMERGENCY DEPARTMENT Provider Note   CSN: [id number] Arrival date & time: 04/01/20  1330     History Chief Complaint  Patient presents with  . Fatigue    Jessica Fleming is a 54 y.o. female with a past medical history of morbid obesity, GERD, who presents today for evaluation of multiple complaints.  She reports that over the past 2 weeks she has had fatigue and body wide pain.  She denies any fevers.  She reports normal diet.  She denies cough or shortness of breath.  No leg swelling.  She denies any specific chest pain.  She reports a few days ago she had mild urinary pain, however that is since resolved.  She denies increased frequency or urgency.  She states that her arms and legs hurt symmetrically bilaterally.  She did go to [place] however has been back for over a month.  She denies any exposures there or possibility of catching something there that would make her sick.  She has not been vaccinated against coronavirus.  Chart review shows that on 5/29 she was seen in the emergency room for body wide pain and fatigue, at that point suspected she had GERD and was discharged.  Of note patient was offered professional medical interpreter, she declined stating that she wished to have her daughter who is present translate if needed for her.  She is concerned she has either an iron deficiency or low potassium causing her symptoms.   Aside from occasional Tylenol and Prilosec, no other medications.   HPI     Past Medical History:  Diagnosis Date  . GERD (gastroesophageal reflux disease)     Patient Active Problem List   Diagnosis Date Noted  . Left knee pain 09/20/2016  . 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 Morbid obesity due to excess calories (HCC) 10/25/2014  . Allergic rhinitis 04/12/2014  . B12 deficiency 04/12/2014  . Presbyopia 07/06/2013  . 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 Hypercholesterolemia 02/21/2013  .  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 No pertinent surgical history.   OB History    Gravida  4   Para  4   Term      Preterm      AB      Living        SAB      TAB      Ectopic      Multiple      Live Births              Family History  Problem Relation Age of Onset  . Diabetes Father     Social History   Tobacco Use  . Smoking status: Never Smoker  . Smokeless tobacco: Never Used  Vaping Use  . Vaping Use: Never used  Substance Use Topics  . Alcohol use: No  . Drug use: No    Home Medications Prior to Admission medications   Medication Sig Start Date End Date Taking? Authorizing Provider  omeprazole (PRILOSEC) 20 MG capsule Take 1 capsule (20 mg total) by mouth daily. 02/13/20   Molpus, John, MD    Allergies    Fish oil, Hydrocodone-homatropine, and Prednisone  Review of Systems   Review of Systems  Constitutional: Positive for fatigue. Negative for activity change, appetite change, chills, diaphoresis and fever.  Respiratory: Negative for choking,  chest tightness and shortness of breath.   Cardiovascular: Negative for chest pain.  Gastrointestinal: Negative for abdominal pain, diarrhea, nausea and vomiting.  Genitourinary: Negative for decreased urine volume, flank pain, urgency, vaginal bleeding and vaginal discharge.  Musculoskeletal: Positive for arthralgias and myalgias.  Skin: Negative for color change, rash and wound.  Neurological: Positive for weakness (Subjective, bilateral and diffuse). Negative for headaches.  Psychiatric/Behavioral: Negative for confusion.  All other systems reviewed and are negative.   Physical Exam Updated Vital Signs BP 129/78 (BP Location: Right Arm)   Pulse 69   Temp 97.9 F (36.6 C) (Oral)   Resp 16   Ht 5\' 1"  (1.549 m)   Wt 97.8 kg   SpO2 100%   BMI 40.74 kg/m     Physical Exam Vitals and nursing note reviewed.  Constitutional:      General: She is not in acute distress.    Appearance: She is well-developed. She is not diaphoretic.  HENT:     Head: Normocephalic and atraumatic.     Mouth/Throat:     Mouth: Mucous membranes are moist.  Eyes:     General: No scleral icterus.       Right eye: No discharge.        Left eye: No discharge.     Conjunctiva/sclera: Conjunctivae normal.  Cardiovascular:     Rate and Rhythm: Normal rate and regular rhythm.     Pulses: Normal pulses.     Heart sounds: Normal heart sounds.  Pulmonary:     Effort: Pulmonary effort is normal. No respiratory distress.     Breath sounds: No stridor.  Abdominal:     General: There is no distension.     Tenderness: There is no abdominal tenderness. There is no guarding.  Musculoskeletal:        General: No deformity.     Cervical back: Normal range of motion and neck supple. No rigidity.     Right lower leg: No edema.     Left lower leg: No edema.  Skin:    General: Skin is warm and dry.  Neurological:     Mental Status: She is alert and oriented to person, place, and time.     Motor: No weakness or abnormal muscle tone.     Gait: Gait normal.  Psychiatric:        Mood and Affect: Mood normal.        Behavior: Behavior normal.     ED Results / Procedures / Treatments   Labs (all labs ordered are listed, but only abnormal results are displayed) Labs Reviewed  COMPREHENSIVE METABOLIC PANEL - Abnormal; Notable for the following components:      Result Value   Glucose, Bld 101 (*)    Total Bilirubin 0.2 (*)    All other components within normal limits  URINALYSIS, ROUTINE W REFLEX MICROSCOPIC - Abnormal; Notable for the following components:   Hgb urine dipstick LARGE (*)    All other components within normal limits  URINALYSIS, MICROSCOPIC (REFLEX) - Abnormal; Notable for the following components:   Bacteria, UA FEW (*)    All other components within  normal limits  SARS CORONAVIRUS 2 BY RT PCR (HOSPITAL ORDER, PERFORMED IN [facility])  URINE CULTURE  CBC WITH DIFFERENTIAL/PLATELET  LIPASE, BLOOD  PREGNANCY, URINE    EKG None  Radiology DG Chest 2 View  Result Date: 04/01/2020 CLINICAL DATA:  Fatigue EXAM: CHEST - 2 VIEW COMPARISON:  November 17, 2018 FINDINGS:  The heart size and mediastinal contours are within normal limits. Both lungs are clear. The visualized skeletal structures are unremarkable. IMPRESSION: No active cardiopulmonary disease. Electronically Signed   By: Jonna Clark M.D.   On: 04/01/2020 16:43    Procedures Procedures (including critical care time)  Medications Ordered in ED Medications - No data to display  ED Course  I have reviewed the triage vital signs and the nursing notes.  Pertinent labs & imaging results that were available during my care of the patient were reviewed by me and considered in my medical decision making (see chart for details).    MDM Rules/Calculators/A&P                         Patient is a 54 year old woman who presents today for evaluation of multiple weeks of diffuse, body wide pain and fatigue.  She is concerned either her potassium or her iron are low.  Labs are obtained, CBC and CMP without cause for her symptoms found.  Pregnancy test is negative.  Chest x-ray was obtained without evidence of consolidation, abnormalities such as evidence of active TB, or other abnormalities.  Her urine does have few bacteria.  She does not have flank pain or active urinary symptoms, she did report mild dysuria a few days ago, based on that with the abnormal UA we will send for culture.  Covid test is sent based on her symptoms and her not being vaccinated against Covid.  Overall she is well-appearing, afebrile, and hemodynamically stable.  She denies taking any statins.    Recommended PCP follow up.   [place] denies any possible exposures  that may be related to her symptoms.   Return precautions were discussed with patient who states their understanding.  At the time of discharge patient denied any unaddressed complaints or concerns.  Patient is agreeable for discharge home.  Note: Portions of this report may have been transcribed using voice recognition software. Every effort was made to ensure accuracy; however, inadvertent computerized transcription errors may be present  Final Clinical Impression(s) / ED Diagnoses Final diagnoses:  Fatigue, unspecified type  Myalgia    Rx / DC Orders ED Discharge Orders    None       Cristina Gong, PA-C 04/01/20 1815    Melene Plan, DO 04/01/20 1919

## 2020-04-02 LAB — URINE CULTURE: Culture: NO GROWTH

## 2020-06-18 ENCOUNTER — Other Ambulatory Visit: Payer: Self-pay

## 2020-06-18 ENCOUNTER — Emergency Department (HOSPITAL_BASED_OUTPATIENT_CLINIC_OR_DEPARTMENT_OTHER): Payer: Medicaid Other

## 2020-06-18 ENCOUNTER — Emergency Department (HOSPITAL_BASED_OUTPATIENT_CLINIC_OR_DEPARTMENT_OTHER)
Admission: EM | Admit: 2020-06-18 | Discharge: 2020-06-18 | Disposition: A | Discharge status: Home / Self Care | Payer: Medicaid Other | Attending: Emergency Medicine | Admitting: Emergency Medicine

## 2020-06-18 ENCOUNTER — Encounter (HOSPITAL_BASED_OUTPATIENT_CLINIC_OR_DEPARTMENT_OTHER): Payer: Self-pay | Admitting: Emergency Medicine

## 2020-06-18 DIAGNOSIS — R519 Headache, unspecified: Secondary | ICD-10-CM | POA: Insufficient documentation

## 2020-06-18 HISTORY — DX: Disorder of thyroid, unspecified: E07.9

## 2020-06-18 NOTE — ED Provider Notes (Signed)
[facility] Emergency Department Provider Note MRN:  [id number]  Arrival date & time: 06/18/20     Chief Complaint   Headache   History of Present Illness   Jessica Fleming is a 54 y.o. year-old female with a history of GERD presenting to the ED with chief complaint of headache.  Gradual onset headache beginning yesterday at 6 PM, located around the left eye and associated with pain and hypersensitive skin to the left upper eyelid and  forehead.  Unable to sleep due to the discomfort.  Denies nausea or vomiting, no numbness or weakness to the arms or legs, no rash, no chest pain or shortness of breath, no abdominal pain.  Review of Systems  A complete 10 system review of systems was obtained and all systems are negative except as noted in the HPI and PMH.   Patient's Health History    Past Medical History:  Diagnosis Date  . GERD (gastroesophageal reflux disease)   . Thyroid disease     History reviewed. No pertinent surgical history.  Family History  Problem Relation Age of Onset  . Diabetes Father     Social History   Socioeconomic History  . Marital status: Married    Spouse name: Not on file  . Number of children: Not on file  . Years of education: Not on file  . Highest education level: Not on file  Occupational History  . Not on file  Tobacco Use  . Smoking status: Never Smoker  . Smokeless tobacco: Never Used  Vaping Use  . Vaping Use: Never used  Substance and Sexual Activity  . Alcohol use: No  . Drug use: No  . Sexual activity: Not on file  Other Topics Concern  . Not on file  Social History Narrative  . Not on file   Social Determinants of Health   Financial Resource Strain:   . Difficulty of Paying Living Expenses: Not on file  Food Insecurity:   . Worried About Programme researcher, broadcasting/film/video in the Last Year: Not on file  . Ran Out of Food in the Last Year: Not on file  Transportation Needs:   . Lack of Transportation  (Medical): Not on file  . Lack of Transportation (Non-Medical): Not on file  Physical Activity:   . Days of Exercise per Week: Not on file  . Minutes of Exercise per Session: Not on file  Stress:   . Feeling of Stress : Not on file  Social Connections:   . Frequency of Communication with Friends and Family: Not on file  . Frequency of Social Gatherings with Friends and Family: Not on file  . Attends Religious Services: Not on file  . Active Member of Clubs or Organizations: Not on file  . Attends Banker Meetings: Not on file  . Marital Status: Not on file  Intimate Partner Violence:   . Fear of Current or Ex-Partner: Not on file  . Emotionally Abused: Not on file  . Physically Abused: Not on file  . Sexually Abused: Not on file     Physical Exam   Vitals:   06/18/20 0758  BP: 135/84  Pulse: 66  Resp: 18  Temp: 98.1 F (36.7 C)  SpO2: 98%    CONSTITUTIONAL: Well-appearing, NAD NEURO:  Alert and oriented x 3, no focal deficits EYES:  eyes equal and reactive ENT/NECK:  no LAD, no JVD CARDIO: Regular rate, well-perfused, normal S1 and S2 PULM:  CTAB no wheezing  or rhonchi GI/GU:  normal bowel sounds, non-distended, non-tender MSK/SPINE:  No gross deformities, no edema SKIN:  no rash, atraumatic, hypersensitive skin with pain elicited with light touch to the left upper eyelid and left forehead PSYCH:  Appropriate speech and behavior  *Additional and/or pertinent findings included in MDM below  Diagnostic and Interventional Summary    EKG Interpretation  Date/Time:    Ventricular Rate:    PR Interval:    QRS Duration:   QT Interval:    QTC Calculation:   R Axis:     Text Interpretation:        Labs Reviewed - No data to display  CT Head Wo Contrast  Final Result      Medications - No data to display   Procedures  /  Critical Care Procedures  ED Course and Medical Decision Making  I have reviewed the triage vital signs, the nursing notes,  and pertinent available records from the EMR.  Listed above are laboratory and imaging tests that I personally ordered, reviewed, and interpreted and then considered in my medical decision making (see below for details).  Considering cluster headache, shingles, trigeminal neuralgia, intracranial mass.  Screening with CT head.  Patient feels a lot better after oxygen treatment, and so this makes cluster headache more likely.  Anticipating will be able to discharge with neurology follow-up after CT head.     CT head is unremarkable, appropriate for discharge.  Elmer Sow. Pilar Plate, MD [facility] Emergency Medicine [facility] [email or web address]  Final Clinical Impressions(s) / ED Diagnoses     ICD-10-CM   1. Acute nonintractable headache, unspecified headache type  R51.9     ED Discharge Orders         Ordered    Ambulatory referral to Neurology       Comments: An appointment is requested in approximately: 4 weeks   06/18/20 0945           Discharge Instructions Discussed with and Provided to Patient:     Discharge Instructions     You were evaluated in the Emergency Department and after careful evaluation, we did not find any emergent condition requiring admission or further testing in the hospital.  Your exam/testing today is overall reassuring.  Your symptoms may be due to a cluster headache.  Your symptoms improved in the emergency department with oxygen treatment.  We recommend follow-up with a neurologist for further management.  If you develop rash to the painful area, please return to the emergency department.  Please return to the Emergency Department if you experience any worsening of your condition.   Thank you for allowing [place] to be a part of your care.       Sabas Sous, MD 06/18/20 [phone]

## 2020-06-18 NOTE — ED Triage Notes (Signed)
Headache around L eye since 6pm yesterday. Reports painful to touch as well. Denies problem with vision.

## 2020-06-18 NOTE — ED Notes (Signed)
Pt placed on O2 15L per [facility] per EDP

## 2020-06-18 NOTE — ED Notes (Signed)
ED Provider at bedside. 

## 2020-06-18 NOTE — Discharge Instructions (Signed)
You were evaluated in the Emergency Department and after careful evaluation, we did not find any emergent condition requiring admission or further testing in the hospital.  Your exam/testing today is overall reassuring.  Your symptoms may be due to a cluster headache.  Your symptoms improved in the emergency department with oxygen treatment.  We recommend follow-up with a neurologist for further management.  If you develop rash to the painful area, please return to the emergency department.  Please return to the Emergency Department if you experience any worsening of your condition.   Thank you for allowing [place] to be a part of your care.

## 2020-10-16 ENCOUNTER — Emergency Department (HOSPITAL_COMMUNITY)
Admission: EM | Admit: 2020-10-16 | Discharge: 2020-10-16 | Disposition: A | Discharge status: Home / Self Care | Payer: HRSA Program | Attending: Emergency Medicine | Admitting: Emergency Medicine

## 2020-10-16 ENCOUNTER — Encounter (HOSPITAL_COMMUNITY): Payer: Self-pay

## 2020-10-16 ENCOUNTER — Emergency Department (HOSPITAL_COMMUNITY): Payer: HRSA Program

## 2020-10-16 DIAGNOSIS — U071 COVID-19: Secondary | ICD-10-CM | POA: Diagnosis not present

## 2020-10-16 DIAGNOSIS — R4182 Altered mental status, unspecified: Secondary | ICD-10-CM | POA: Insufficient documentation

## 2020-10-16 DIAGNOSIS — R519 Headache, unspecified: Secondary | ICD-10-CM | POA: Diagnosis present

## 2020-10-16 LAB — URINALYSIS, ROUTINE W REFLEX MICROSCOPIC
Bilirubin Urine: NEGATIVE
Glucose, UA: NEGATIVE mg/dL
Ketones, ur: NEGATIVE mg/dL
Leukocytes,Ua: NEGATIVE
Nitrite: NEGATIVE
Protein, ur: NEGATIVE mg/dL
Specific Gravity, Urine: 1.006 (ref 1.005–1.030)
pH: 5 (ref 5.0–8.0)

## 2020-10-16 LAB — CBC WITH DIFFERENTIAL/PLATELET
Abs Immature Granulocytes: 0.02 10*3/uL (ref 0.00–0.07)
Basophils Absolute: 0 10*3/uL (ref 0.0–0.1)
Basophils Relative: 0 %
Eosinophils Absolute: 0 10*3/uL (ref 0.0–0.5)
Eosinophils Relative: 1 %
HCT: 36.1 % (ref 36.0–46.0)
Hemoglobin: 11.3 g/dL — ABNORMAL LOW (ref 12.0–15.0)
Immature Granulocytes: 0 %
Lymphocytes Relative: 19 %
Lymphs Abs: 1.1 10*3/uL (ref 0.7–4.0)
MCH: 27.4 pg (ref 26.0–34.0)
MCHC: 31.3 g/dL (ref 30.0–36.0)
MCV: 87.6 fL (ref 80.0–100.0)
Monocytes Absolute: 0.3 10*3/uL (ref 0.1–1.0)
Monocytes Relative: 5 %
Neutro Abs: 4.6 10*3/uL (ref 1.7–7.7)
Neutrophils Relative %: 75 %
Platelets: 180 10*3/uL (ref 150–400)
RBC: 4.12 MIL/uL (ref 3.87–5.11)
RDW: 13.2 % (ref 11.5–15.5)
WBC: 6.1 10*3/uL (ref 4.0–10.5)
nRBC: 0 % (ref 0.0–0.2)

## 2020-10-16 LAB — COMPREHENSIVE METABOLIC PANEL
ALT: 69 U/L — ABNORMAL HIGH (ref 0–44)
AST: 71 U/L — ABNORMAL HIGH (ref 15–41)
Albumin: 3.3 g/dL — ABNORMAL LOW (ref 3.5–5.0)
Alkaline Phosphatase: 76 U/L (ref 38–126)
Anion gap: 10 (ref 5–15)
BUN: 9 mg/dL (ref 6–20)
CO2: 23 mmol/L (ref 22–32)
Calcium: 8.3 mg/dL — ABNORMAL LOW (ref 8.9–10.3)
Chloride: 103 mmol/L (ref 98–111)
Creatinine, Ser: 0.56 mg/dL (ref 0.44–1.00)
GFR, Estimated: >60 mL/min (ref >60)
Glucose, Bld: 112 mg/dL — ABNORMAL HIGH (ref 70–99)
Potassium: 3.7 mmol/L (ref 3.5–5.1)
Sodium: 136 mmol/L (ref 135–145)
Total Bilirubin: 0.5 mg/dL (ref 0.3–1.2)
Total Protein: 7.2 g/dL (ref 6.5–8.1)

## 2020-10-16 LAB — POC SARS CORONAVIRUS 2 AG -  ED: SARS Coronavirus 2 Ag: POSITIVE — AB

## 2020-10-16 MED ORDER — SODIUM CHLORIDE 0.9 % IV SOLN: 100.0000 mg | Freq: Every day | INTRAVENOUS | Status: DC

## 2020-10-16 MED ORDER — ONDANSETRON HCL 4 MG/2ML IJ SOLN
4.0000 mg | Freq: Once | INTRAMUSCULAR | Status: AC
  Administered 2020-10-16: 4 mg via INTRAVENOUS
  Filled 2020-10-16: qty 2

## 2020-10-16 MED ORDER — LACTATED RINGERS IV BOLUS
1000.0000 mL | Freq: Once | INTRAVENOUS | Status: AC
  Administered 2020-10-16: 1000 mL via INTRAVENOUS

## 2020-10-16 MED ORDER — SODIUM CHLORIDE 0.9 % IV SOLN
200.0000 mg | Freq: Once | INTRAVENOUS | Status: AC
  Administered 2020-10-16: 200 mg via INTRAVENOUS
  Filled 2020-10-16: qty 200

## 2020-10-16 MED ORDER — ACETAMINOPHEN 325 MG PO TABS
650.0000 mg | ORAL_TABLET | Freq: Once | ORAL | Status: AC
  Administered 2020-10-16: 650 mg via ORAL
  Filled 2020-10-16: qty 2

## 2020-10-16 NOTE — ED Triage Notes (Signed)
Pt comes via [facility] from home for AMS for the past several hours, family is all covid +, pt speaks Urdu. LSN 945pm.

## 2020-10-16 NOTE — ED Provider Notes (Signed)
[facility] EMERGENCY DEPARTMENT Provider Note   CSN: [id number] Arrival date & time: 10/16/20  6010     History Chief Complaint  Patient presents with  . Altered Mental Status    Jessica Fleming is a 56 y.o. female.  The history is provided by the patient and a relative.  Altered Mental Status Presenting symptoms: confusion   Severity:  Moderate Episode history:  Single Timing:  Constant Progression:  Improving Chronicity:  New Associated symptoms: headaches    Patient with history of GERD presents with symptoms of COVID-19.  She reports her family member has Covid  patient reports over the past 4 days she has had cough, headache and body aches.  Apparently tonight she woke up and appeared confused and 911 was called.  Patient is unvaccinated. She denies any vomiting or diarrhea.  She reports mild shortness of breath    Past Medical History:  Diagnosis Date  . GERD (gastroesophageal reflux disease)   . Thyroid disease     Patient Active Problem List   Diagnosis Date Noted  . Left knee pain 09/20/2016  . 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 Morbid obesity due to excess calories (HCC) 10/25/2014  . Allergic rhinitis 04/12/2014  . B12 deficiency 04/12/2014  . Presbyopia 07/06/2013  . 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 Hypercholesterolemia 02/21/2013  . 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 No pertinent surgical history.   OB History    Gravida  4   Para  4   Term      Preterm      AB      Living        SAB      IAB      Ectopic      Multiple      Live Births              Family History  Problem Relation Age of Onset  . Diabetes Father     Social History   Tobacco Use  . Smoking status: Never Smoker  . Smokeless tobacco: Never Used  Vaping Use   . Vaping Use: Never used  Substance Use Topics  . Alcohol use: No  . Drug use: No    Home Medications Prior to Admission medications   Medication Sig Start Date End Date Taking? Authorizing Provider  levothyroxine (SYNTHROID) 25 MCG tablet Take 25 mcg by mouth daily before breakfast.    [provider]  omeprazole (PRILOSEC) 20 MG capsule Take 1 capsule (20 mg total) by mouth daily. 02/13/20   Molpus, John, MD    Allergies    Fish oil, Hydrocodone-homatropine, and Prednisone  Review of Systems   Review of Systems  Constitutional: Positive for chills.  Respiratory: Positive for cough and shortness of breath.   Musculoskeletal: Positive for myalgias.  Neurological: Positive for headaches.  Psychiatric/Behavioral: Positive for confusion.  All other systems reviewed and are negative.   Physical Exam Updated Vital Signs SpO2 99%   Physical Exam CONSTITUTIONAL: Well developed/well nourished HEAD: Normocephalic/atraumatic EYES: EOMI/PERRL ENMT: Mucous membranes moist NECK: supple no meningeal signs SPINE/BACK:entire spine nontender CV: S1/S2 noted, no murmurs/rubs/gallops noted LUNGS: Lungs are clear to auscultation bilaterally, no apparent distress ABDOMEN: soft, nontender, no rebound or guarding, bowel sounds noted throughout abdomen GU:no cva tenderness NEURO: Pt is awake/alert/appropriate, moves all extremitiesx4.  No  facial droop.  No arm/leg drift EXTREMITIES: pulses normal/equal, full ROM SKIN: warm, color normal PSYCH: no abnormalities of mood noted, alert and oriented to situation  ED Results / Procedures / Treatments   Labs (all labs ordered are listed, but only abnormal results are displayed) Labs Reviewed  CBC WITH DIFFERENTIAL/PLATELET - Abnormal; Notable for the following components:      Result Value   Hemoglobin 11.3 (*)    All other components within normal limits  COMPREHENSIVE METABOLIC PANEL - Abnormal; Notable for the following components:    Glucose, Bld 112 (*)    Calcium 8.3 (*)    Albumin 3.3 (*)    AST 71 (*)    ALT 69 (*)    All other components within normal limits  URINALYSIS, ROUTINE W REFLEX MICROSCOPIC - Abnormal; Notable for the following components:   Color, Urine STRAW (*)    Hgb urine dipstick MODERATE (*)    Bacteria, UA RARE (*)    All other components within normal limits  POC SARS CORONAVIRUS 2 AG -  ED - Abnormal; Notable for the following components:   SARS Coronavirus 2 Ag POSITIVE (*)    All other components within normal limits    EKG EKG Interpretation  Date/Time:  Sunday October 16 2020 00:47:55 EST Ventricular Rate:  85 PR Interval:    QRS Duration: 90 QT Interval:  385 QTC Calculation: 458 R Axis:   42 Text Interpretation: Sinus rhythm Low voltage, precordial leads Confirmed by Zadie Rhine ([id number]) on 10/16/2020 1:15:26 AM   Radiology DG Chest Port 1 View  Result Date: 10/16/2020 CLINICAL DATA:  Shortness of breath EXAM: PORTABLE CHEST 1 VIEW COMPARISON:  04/01/2020 FINDINGS: Cardiac shadow is within normal limits. The lungs are well aerated bilaterally. No focal infiltrate or sizable effusion is seen. No bony abnormality is noted. IMPRESSION: No acute abnormality noted. Electronically Signed   By: Alcide Clever M.D.   On: 10/16/2020 01:11    Procedures Procedures   Medications Ordered in ED Medications  remdesivir 200 mg in sodium chloride 0.9% 250 mL IVPB (0 mg Intravenous Stopped 10/16/20 0310)    Followed by  remdesivir 100 mg in sodium chloride 0.9 % 100 mL IVPB (has no administration in time range)  acetaminophen (TYLENOL) tablet 650 mg (650 mg Oral Given 10/16/20 0158)  lactated ringers bolus 1,000 mL (0 mLs Intravenous Stopped 10/16/20 0445)  ondansetron (ZOFRAN) injection 4 mg (4 mg Intravenous Given 10/16/20 0445)    ED Course  I have reviewed the triage vital signs and the nursing notes.  Pertinent labs & imaging results that were available during my care of the  patient were reviewed by me and considered in my medical decision making (see chart for details).    MDM Rules/Calculators/A&P                          1:36 AM I spoke to her daughter via the phone.  She reports patient has had Covid symptoms for several days with a positive exposure.  Tonight patient woke up and was feeling very hot and appeared confused and not herself.  Afterwards she began to sweat.  I suspect patient had a fever which likely contributed to this episode.  Patient is now awake/alert in no acute distress.  There is no focal weakness noted.  Low suspicion for stroke.  No signs of meningitis. Patient is Covid positive here.  Labs are pending at this time.  I personally reviewed her chest x-ray and it is negative 5:20 AM Overall labs are reassuring.  Patient is more alert and talkative.  She is taking p.o. fluids. No hypoxia on RA   No focal weakness is noted.  She reports her dizziness is resolving.  Patient feels comfortable for discharge home.  She was given 1 dose of remdesivir here in the ER, and outpatient referral for continued infusions has been placed Discussed the plan of care with patient and her daughter via phone.  Jessica Fleming was evaluated in Emergency Department on 10/16/2020 for the symptoms described in the history of present illness. She was evaluated in the context of the global COVID-19 pandemic, which necessitated consideration that the patient might be at risk for infection with the SARS-CoV-2 virus that causes COVID-19. Institutional protocols and algorithms that pertain to the evaluation of patients at risk for COVID-19 are in a state of rapid change based on information released by regulatory bodies including the CDC and federal and state organizations. These policies and algorithms were followed during the patient's care in the ED.  Final Clinical Impression(s) / ED Diagnoses Final diagnoses:  COVID-19    Rx / DC Orders ED Discharge Orders    None        Zadie Rhine, MD 10/16/20 [phone]

## 2020-10-16 NOTE — ED Notes (Signed)
Pt's daughter given an update ?

## 2020-10-16 NOTE — ED Notes (Signed)
Pt daughter [phone] Ernesta Amble

## 2020-10-17 ENCOUNTER — Encounter: Payer: Self-pay | Admitting: Physician Assistant

## 2020-10-17 ENCOUNTER — Other Ambulatory Visit: Payer: Self-pay | Admitting: Physician Assistant

## 2020-10-17 ENCOUNTER — Ambulatory Visit (HOSPITAL_COMMUNITY)
Admission: RE | Admit: 2020-10-17 | Discharge: 2020-10-17 | Disposition: A | Discharge status: Home / Self Care | Payer: Medicaid Other | Source: Ambulatory Visit | Attending: Pulmonary Disease | Admitting: Pulmonary Disease

## 2020-10-17 DIAGNOSIS — J1289 Other viral pneumonia: Secondary | ICD-10-CM | POA: Insufficient documentation

## 2020-10-17 DIAGNOSIS — U071 COVID-19: Secondary | ICD-10-CM | POA: Diagnosis present

## 2020-10-17 MED ORDER — METHYLPREDNISOLONE SODIUM SUCC 125 MG IJ SOLR: 125.0000 mg | Freq: Once | INTRAMUSCULAR | Status: DC | PRN

## 2020-10-17 MED ORDER — FAMOTIDINE IN NACL 20-0.9 MG/50ML-% IV SOLN: 20.0000 mg | Freq: Once | INTRAVENOUS | Status: DC | PRN

## 2020-10-17 MED ORDER — EPINEPHRINE 0.3 MG/0.3ML IJ SOAJ: 0.3000 mg | Freq: Once | INTRAMUSCULAR | Status: DC | PRN

## 2020-10-17 MED ORDER — DIPHENHYDRAMINE HCL 50 MG/ML IJ SOLN: 50.0000 mg | Freq: Once | INTRAMUSCULAR | Status: DC | PRN

## 2020-10-17 MED ORDER — ALBUTEROL SULFATE HFA 108 (90 BASE) MCG/ACT IN AERS: 2.0000 | INHALATION_SPRAY | Freq: Once | RESPIRATORY_TRACT | Status: DC | PRN

## 2020-10-17 MED ORDER — SODIUM CHLORIDE 0.9 % IV SOLN: INTRAVENOUS | Status: DC | PRN

## 2020-10-17 MED ORDER — SODIUM CHLORIDE 0.9 % IV SOLN: 100.0000 mg | Freq: Once | INTRAVENOUS | Status: DC

## 2020-10-17 MED ORDER — SODIUM CHLORIDE 0.9 % IV SOLN
100.0000 mg | Freq: Once | INTRAVENOUS | Status: AC
  Administered 2020-10-17: 100 mg via INTRAVENOUS

## 2020-10-17 NOTE — Discharge Instructions (Signed)

## 2020-10-17 NOTE — Progress Notes (Signed)
Patient reviewed Fact Sheet for Patients, Parents, and Caregivers for Emergency Use Authorization (EUA) of sotrovimab for the Treatment of Coronavirus. Patient also reviewed and is agreeable to the estimated cost of treatment. Patient is agreeable to proceed.   

## 2020-10-17 NOTE — Discharge Summary (Signed)
  Diagnosis: COVID-19  Physician:  Procedure: [facility] Infusion [facility] Med: remdesivir infusion - Provided patient with remdesivir fact sheet for patients, parents and caregivers prior to infusion.  Complications: No immediate complications noted.  Discharge: Discharged home   Jessica Fleming 10/17/2020

## 2020-10-17 NOTE — Progress Notes (Signed)
I connected by phone with Jessica Fleming on 10/17/2020 at 12:23 PM to discuss the potential use of the antiviral REMDESIVIR for acute COVID-19 viral infection in non-hospitalized patients.   This patient is a 55 y.o. female that meets the FDA criteria for Emergency Use Authorization of REMDESIVIR.  Has a (+) direct SARS-CoV-2 viral test result  Has mild or moderate symptoms related to COVID-19  Is within 7 days of symptom onset  Has at least one of the high risk factor(s) for progression to severe COVID-19 and/or hospitalization as defined in NIH Guidelines and EUA.   Specific high risk criteria : BMI > 25 and Other high risk medical condition per CDC:  high SVI, unvaccinated   I have spoken and communicated the following to the patient or parent/caregiver regarding COVID-19 IV Antiviral treatment:  1. FDA has authorized the emergency use for the treatment of mild to moderate COVID-19 in adults and pediatric patients with positive results of SARS-CoV-2 testing who are 53 years of age and older weighing at least 40 kg, and who are at high risk for progressing to severe COVID-19 and/or hospitalization.  2. The significant known and potential risks and benefits in receiving REMDESIVIR in accordance with current NIH treatment guidelines.   3. Information on available alternative treatments and the risks and benefits of those alternatives, including clinical trials that may be accessible to the patient.   4. Patients treated with antiviral therapy should continue to isolate and use infection control measures (e.g., wear mask, isolate, social distance, avoid sharing personal items, clean and disinfect "high touch" surfaces, and frequent handwashing) according to CDC guidelines.   5. The patient or parent/caregiver has the option to accept or refuse REMDESIVIR therapy and has had the opportunity to have all questions addressed prior to consenting for treatment.    After reviewing this information  with the patient, she has decided to proceed with the 3 day course of treatment. She received one dose in the ER. We have scheduled day 2 and 3 in the outpatient infusion center.   Cline Crock, PA-C 10/17/2020  12:23 PM

## 2020-10-18 ENCOUNTER — Ambulatory Visit (HOSPITAL_COMMUNITY): Admission: RE | Admit: 2020-10-18 | Payer: Medicaid Other | Source: Ambulatory Visit

## 2020-10-19 ENCOUNTER — Ambulatory Visit (HOSPITAL_COMMUNITY)
Admission: RE | Admit: 2020-10-19 | Discharge: 2020-10-19 | Disposition: A | Discharge status: Home / Self Care | Payer: Medicaid Other | Source: Ambulatory Visit | Attending: Pulmonary Disease | Admitting: Pulmonary Disease

## 2020-10-19 DIAGNOSIS — U071 COVID-19: Secondary | ICD-10-CM

## 2020-12-31 ENCOUNTER — Emergency Department (HOSPITAL_BASED_OUTPATIENT_CLINIC_OR_DEPARTMENT_OTHER)
Admission: EM | Admit: 2020-12-31 | Discharge: 2020-12-31 | Disposition: A | Discharge status: Home / Self Care | Payer: Medicaid Other | Attending: Emergency Medicine | Admitting: Emergency Medicine

## 2020-12-31 ENCOUNTER — Encounter (HOSPITAL_BASED_OUTPATIENT_CLINIC_OR_DEPARTMENT_OTHER): Payer: Self-pay | Admitting: Emergency Medicine

## 2020-12-31 ENCOUNTER — Emergency Department (HOSPITAL_BASED_OUTPATIENT_CLINIC_OR_DEPARTMENT_OTHER): Payer: Medicaid Other

## 2020-12-31 DIAGNOSIS — X501XXA Overexertion from prolonged static or awkward postures, initial encounter: Secondary | ICD-10-CM | POA: Insufficient documentation

## 2020-12-31 DIAGNOSIS — E079 Disorder of thyroid, unspecified: Secondary | ICD-10-CM | POA: Insufficient documentation

## 2020-12-31 DIAGNOSIS — Y92009 Unspecified place in unspecified non-institutional (private) residence as the place of occurrence of the external cause: Secondary | ICD-10-CM | POA: Insufficient documentation

## 2020-12-31 DIAGNOSIS — Z79899 Other long term (current) drug therapy: Secondary | ICD-10-CM | POA: Insufficient documentation

## 2020-12-31 DIAGNOSIS — S39012A Strain of muscle, fascia and tendon of lower back, initial encounter: Secondary | ICD-10-CM | POA: Insufficient documentation

## 2020-12-31 LAB — HCG, SERUM, QUALITATIVE: Preg, Serum: NEGATIVE

## 2020-12-31 MED ORDER — HYDROMORPHONE HCL 1 MG/ML IJ SOLN
0.5000 mg | Freq: Once | INTRAMUSCULAR | Status: AC
  Administered 2020-12-31: 0.5 mg via INTRAVENOUS
  Filled 2020-12-31: qty 1

## 2020-12-31 MED ORDER — DEXAMETHASONE SODIUM PHOSPHATE 10 MG/ML IJ SOLN
10.0000 mg | Freq: Once | INTRAMUSCULAR | Status: AC
  Administered 2020-12-31: 10 mg via INTRAVENOUS
  Filled 2020-12-31: qty 1

## 2020-12-31 MED ORDER — OXYCODONE-ACETAMINOPHEN 5-325 MG PO TABS: 1.0000 | ORAL_TABLET | ORAL | 0 refills | Status: AC | PRN

## 2020-12-31 MED ORDER — ONDANSETRON HCL 4 MG/2ML IJ SOLN
4.0000 mg | Freq: Once | INTRAMUSCULAR | Status: AC
  Administered 2020-12-31: 4 mg via INTRAVENOUS
  Filled 2020-12-31: qty 2

## 2020-12-31 NOTE — ED Triage Notes (Signed)
Per EMS pt Back pain X 1 day worse tonight while trying rinse hair in tub.

## 2020-12-31 NOTE — ED Provider Notes (Signed)
[facility] EMERGENCY DEPARTMENT Provider Note   CSN: [id number] Arrival date & time: 12/31/20  0102     History Chief Complaint  Patient presents with  . Back Pain    Jessica Fleming is a 55 y.o. female.  Patient presents to the emergency department with complaints of low back pain.  Patient was doing chores around the house and they do a lot of bending earlier today.  She started having pain earlier today which was mild and has worsened over the course of the day.  Patient now having severe pain in the lower back that radiates to both legs.  No numbness or tingling.  No direct injury.        Past Medical History:  Diagnosis Date  . GERD (gastroesophageal reflux disease)   . Morbid obesity (HCC)   . Thyroid disease     Patient Active Problem List   Diagnosis Date Noted  . Morbid obesity (HCC)   . Left knee pain 09/20/2016  . 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 Morbid obesity due to excess calories (HCC) 10/25/2014  . Allergic rhinitis 04/12/2014  . B12 deficiency 04/12/2014  . Presbyopia 07/06/2013  . 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 Hypercholesterolemia 02/21/2013  . 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 No pertinent surgical history.   OB History    Gravida  4   Para  4   Term      Preterm      AB      Living        SAB      IAB      Ectopic      Multiple      Live Births              Family History  Problem Relation Age of Onset  . Diabetes Father     Social History   Tobacco Use  . Smoking status: Never Smoker  . Smokeless tobacco: Never Used  Vaping Use  . Vaping Use: Never used  Substance Use Topics  . Alcohol use: No  . Drug use: No    Home Medications Prior to Admission medications   Medication Sig Start Date End Date Taking?  Authorizing Provider  atropine 1 % ophthalmic solution Place 1 drop into the left eye in the morning and at bedtime for 14 days. 06/24/20  Yes [provider]  mycophenolate (CELLCEPT) 500 MG tablet Take by mouth. 07/19/20  Yes [provider]  oxyCODONE-acetaminophen (PERCOCET) 5-325 MG tablet Take 1 tablet by mouth every 4 (four) hours as needed. 12/31/20  Yes Tamieka Rancourt, Canary Brim, MD  prednisoLONE acetate (PRED FORTE) 1 % ophthalmic suspension Place 1 drop into the left eye 4 times daily. 06/24/20  Yes [provider]  levothyroxine (SYNTHROID) 25 MCG tablet Take 25 mcg by mouth daily before breakfast.    [provider]  omeprazole (PRILOSEC) 20 MG capsule Take 1 capsule (20 mg total) by mouth daily. 02/13/20   Molpus, Jonny Ruiz, MD    Allergies    Fish oil, Hydrocodone-homatropine, and Prednisone  Review of Systems   Review of Systems  Musculoskeletal: Positive for back pain.  Neurological: Negative for weakness.  All other systems reviewed and are negative.   Physical Exam Updated Vital Signs BP 108/65 (BP Location: Left Arm)   Pulse 62  Temp 98.1 F (36.7 C) (Oral)   Resp 17   Ht 5\' 1"  (1.549 m)   Wt 90.7 kg   SpO2 96%   BMI 37.79 kg/m   Physical Exam Vitals and nursing note reviewed.  Constitutional:      General: She is not in acute distress.    Appearance: Normal appearance. She is well-developed.  HENT:     Head: Normocephalic and atraumatic.     Right Ear: Hearing normal.     Left Ear: Hearing normal.     Nose: Nose normal.  Eyes:     Conjunctiva/sclera: Conjunctivae normal.     Pupils: Pupils are equal, round, and reactive to light.  Cardiovascular:     Rate and Rhythm: Regular rhythm.     Heart sounds: S1 normal and S2 normal. No murmur heard. No friction rub. No gallop.   Pulmonary:     Effort: Pulmonary effort is normal. No respiratory distress.     Breath sounds: Normal breath sounds.  Chest:     Chest wall: No  tenderness.  Abdominal:     General: Bowel sounds are normal.     Palpations: Abdomen is soft.     Tenderness: There is no abdominal tenderness. There is no guarding or rebound. Negative signs include Murphy's sign and McBurney's sign.     Hernia: No hernia is present.  Musculoskeletal:     Cervical back: Normal range of motion and neck supple.     Lumbar back: Spasms and tenderness present. Positive right straight leg raise test and positive left straight leg raise test.  Skin:    General: Skin is warm and dry.     Findings: No rash.  Neurological:     Mental Status: She is alert and oriented to person, place, and time.     GCS: GCS eye subscore is 4. GCS verbal subscore is 5. GCS motor subscore is 6.     Cranial Nerves: No cranial nerve deficit.     Sensory: No sensory deficit.     Coordination: Coordination normal.  Psychiatric:        Speech: Speech normal.        Behavior: Behavior normal.        Thought Content: Thought content normal.     ED Results / Procedures / Treatments   Labs (all labs ordered are listed, but only abnormal results are displayed) Labs Reviewed  HCG, SERUM, QUALITATIVE    EKG None  Radiology CT Lumbar Spine Wo Contrast  Result Date: 12/31/2020 CLINICAL DATA:  Low back pain EXAM: CT LUMBAR SPINE WITHOUT CONTRAST TECHNIQUE: Multidetector CT imaging of the lumbar spine was performed without intravenous contrast administration. Multiplanar CT image reconstructions were also generated. COMPARISON:  None. FINDINGS: Segmentation: 5 lumbar type vertebrae. Alignment: Normal. Vertebrae: No acute fracture or focal pathologic process. Paraspinal and other soft tissues: Negative. Disc levels: No spinal canal or neural foraminal stenosis. Multilevel facet arthrosis. IMPRESSION: 1. No acute fracture or static subluxation of the lumbar spine. 2. Multilevel facet arthrosis, which may be a source of local low back pain. Electronically Signed   By: 01/02/2021 M.D.    On: 12/31/2020 03:01    Procedures Procedures   Medications Ordered in ED Medications  ondansetron (ZOFRAN) injection 4 mg (4 mg Intravenous Given 12/31/20 0139)  HYDROmorphone (DILAUDID) injection 0.5 mg (0.5 mg Intravenous Given 12/31/20 0141)  dexamethasone (DECADRON) injection 10 mg (10 mg Intravenous Given 12/31/20 0406)    ED Course  I  have reviewed the triage vital signs and the nursing notes.  Pertinent labs & imaging results that were available during my care of the patient were reviewed by me and considered in my medical decision making (see chart for details).    MDM Rules/Calculators/A&P                          Patient presents to the emergency department for evaluation of back pain.  Patient started having mild low back pain after doing some chores around the house earlier.  Tonight she had sudden worsening of the pain.  Patient complaining of severe sharp pain in the lower back with radiation to the legs.  Examination reveals that she is experiencing spasms of the lumbar region.  She has normal strength and sensation, normal reflexes of the lower extremities.  No red flags or signs of cauda equina syndrome.  A CT scan shows arthritis but no significant abnormality.  She feels much improved after pain medication.  Final Clinical Impression(s) / ED Diagnoses Final diagnoses:  Strain of lumbar region, initial encounter    Rx / DC Orders ED Discharge Orders         Ordered    oxyCODONE-acetaminophen (PERCOCET) 5-325 MG tablet  Every 4 hours PRN        12/31/20 0400           Gilda Crease, MD 12/31/20 [phone]

## 2021-11-16 ENCOUNTER — Encounter (HOSPITAL_BASED_OUTPATIENT_CLINIC_OR_DEPARTMENT_OTHER): Payer: Self-pay | Admitting: *Deleted

## 2021-11-16 ENCOUNTER — Other Ambulatory Visit: Payer: Self-pay

## 2021-11-16 ENCOUNTER — Emergency Department (HOSPITAL_BASED_OUTPATIENT_CLINIC_OR_DEPARTMENT_OTHER)
Admission: EM | Admit: 2021-11-16 | Discharge: 2021-11-16 | Disposition: A | Discharge status: Home / Self Care | Payer: Medicaid Other | Attending: Emergency Medicine | Admitting: Emergency Medicine

## 2021-11-16 DIAGNOSIS — Z20822 Contact with and (suspected) exposure to covid-19: Secondary | ICD-10-CM | POA: Insufficient documentation

## 2021-11-16 DIAGNOSIS — J020 Streptococcal pharyngitis: Secondary | ICD-10-CM | POA: Insufficient documentation

## 2021-11-16 LAB — URINALYSIS, ROUTINE W REFLEX MICROSCOPIC
Bilirubin Urine: NEGATIVE
Glucose, UA: NEGATIVE mg/dL
Ketones, ur: NEGATIVE mg/dL
Nitrite: NEGATIVE
Protein, ur: NEGATIVE mg/dL
Specific Gravity, Urine: 1.02 (ref 1.005–1.030)
pH: 5.5 (ref 5.0–8.0)

## 2021-11-16 LAB — URINALYSIS, MICROSCOPIC (REFLEX)

## 2021-11-16 LAB — RESP PANEL BY RT-PCR (FLU A&B, COVID) ARPGX2
Influenza A by PCR: NEGATIVE
Influenza B by PCR: NEGATIVE
SARS Coronavirus 2 by RT PCR: NEGATIVE

## 2021-11-16 LAB — GROUP A STREP BY PCR: Group A Strep by PCR: DETECTED — AB

## 2021-11-16 MED ORDER — METHOCARBAMOL 500 MG PO TABS
500.0000 mg | ORAL_TABLET | Freq: Once | ORAL | Status: AC
  Administered 2021-11-16: 500 mg via ORAL
  Filled 2021-11-16: qty 1

## 2021-11-16 MED ORDER — AMOXICILLIN 500 MG PO CAPS: 1000.0000 mg | ORAL_CAPSULE | Freq: Every day | ORAL | 0 refills | Status: AC

## 2021-11-16 MED ORDER — METHOCARBAMOL 500 MG PO TABS: 500.0000 mg | ORAL_TABLET | Freq: Two times a day (BID) | ORAL | 0 refills | Status: AC

## 2021-11-16 MED ORDER — PENICILLIN G BENZATHINE 1200000 UNIT/2ML IM SUSY: 1.2000 10*6.[IU] | PREFILLED_SYRINGE | Freq: Once | INTRAMUSCULAR | Status: DC

## 2021-11-16 MED ORDER — LIDOCAINE VISCOUS HCL 2 % MT SOLN: 15.0000 mL | OROMUCOSAL | 0 refills | Status: AC | PRN

## 2021-11-16 MED ORDER — AMOXICILLIN 500 MG PO CAPS
1000.0000 mg | ORAL_CAPSULE | Freq: Once | ORAL | Status: AC
  Administered 2021-11-16: 1000 mg via ORAL
  Filled 2021-11-16: qty 2

## 2021-11-16 NOTE — ED Triage Notes (Addendum)
Body aches, headache, fever, chills, sore throat, and fatigue. She took Tylenol 2 hours ago. Her son has strep. ?

## 2021-11-16 NOTE — Discharge Instructions (Addendum)
Take the antibiotics to help with your strep infection. ?Swish and spit the lidocaine to help with throat discomfort. ?Take the Robaxin as needed to help with symptoms. ?Return to the ER if you start to experience worsening symptoms, chest pain, shortness of breath or uncontrollable vomiting ?

## 2021-11-16 NOTE — ED Provider Notes (Signed)
?[facility] EMERGENCY DEPARTMENT ?Provider Note ? ? ?CSN: [id number] ?Arrival date & time: 11/16/21  1942 ? ?  ? ?History ? ?Chief Complaint  ?Patient presents with  ? URI  ? ? ?Jessica Fleming is a 56 y.o. female presenting to the ED with a chief complaint of sore throat, cough, myalgias.  Symptoms have been going on for the past few days.  Sick contacts including her son that was diagnosed with strep pharyngitis.  She has been taking Tylenol with some improvement in her symptoms.  She feels that she is staying hydrated.  She reports associated chills but no documented fevers.  No diarrhea or vomiting. ? ? ?URI ?Presenting symptoms: cough and sore throat   ?Presenting symptoms: no ear pain, no fever and no rhinorrhea   ?Associated symptoms: myalgias   ?Associated symptoms: no sneezing and no wheezing   ? ?  ? ?Home Medications ?Prior to Admission medications   ?Medication Sig Start Date End Date Taking? Authorizing Provider  ?amoxicillin (AMOXIL) 500 MG capsule Take 2 capsules (1,000 mg total) by mouth daily for 10 days. 11/16/21 11/26/21 Yes Chasyn Cinque, PA-C  ?lidocaine (XYLOCAINE) 2 % solution Use as directed 15 mLs in the mouth or throat as needed for mouth pain. 11/16/21  Yes Mikayela Deats, PA-C  ?methocarbamol (ROBAXIN) 500 MG tablet Take 1 tablet (500 mg total) by mouth 2 (two) times daily. 11/16/21  Yes Loralei Radcliffe, PA-C  ?atropine 1 % ophthalmic solution Place 1 drop into the left eye in the morning and at bedtime for 14 days. 06/24/20   [provider]  ?levothyroxine (SYNTHROID) 25 MCG tablet Take 25 mcg by mouth daily before breakfast.    [provider]  ?mycophenolate (CELLCEPT) 500 MG tablet Take by mouth. 07/19/20   [provider]  ?omeprazole (PRILOSEC) 20 MG capsule Take 1 capsule (20 mg total) by mouth daily. 02/13/20   Molpus, John, MD  ?oxyCODONE-acetaminophen (PERCOCET) 5-325 MG tablet Take 1 tablet by mouth every 4 (four) hours as needed. 12/31/20   Gilda Crease, MD  ?prednisoLONE acetate (PRED FORTE) 1 % ophthalmic suspension Place 1 drop into the left eye 4 times daily. 06/24/20   [provider]  ?   ? ?Allergies    ?Fish oil, Hydrocodone bit-homatrop mbr, and Prednisone   ? ?Review of Systems   ?Review of Systems  ?Constitutional:  Positive for chills. Negative for appetite change and fever.  ?HENT:  Positive for sore throat. Negative for ear pain, rhinorrhea and sneezing.   ?Eyes:  Negative for photophobia and visual disturbance.  ?Respiratory:  Positive for cough. Negative for chest tightness, shortness of breath and wheezing.   ?Cardiovascular:  Negative for chest pain and palpitations.  ?Gastrointestinal:  Negative for abdominal pain, blood in stool, constipation, diarrhea, nausea and vomiting.  ?Genitourinary:  Negative for dysuria, hematuria and urgency.  ?Musculoskeletal:  Positive for myalgias.  ?Skin:  Negative for rash.  ?Neurological:  Negative for dizziness, weakness and light-headedness.  ? ?Physical Exam ?Updated Vital Signs ?BP 131/81 (BP Location: Right Arm)   Pulse 83   Temp 98.9 ?F (37.2 ?C) (Oral)   Resp 18   Ht 5\' 1"  (1.549 m)   Wt 90.7 kg   SpO2 97%   BMI 37.78 kg/m?  ?Physical Exam ?Vitals and nursing note reviewed.  ?Constitutional:   ?   General: She is not in acute distress. ?   Appearance: She is well-developed.  ?HENT:  ?   Head: Normocephalic  and atraumatic.  ?   Nose: Nose normal.  ?   Mouth/Throat:  ?   Pharynx: Uvula midline. Posterior oropharyngeal erythema present.  ?   Tonsils: No tonsillar exudate. 1+ on the right. 1+ on the left.  ?   Comments: Slightly enlarged tonsils noted bilaterally but are symmetrical.  Patient does not appear to be in acute distress. No trismus or drooling present. No pooling of secretions. Patient is tolerating secretions and is not in respiratory distress. No neck pain or tenderness to palpation of the neck. Full active and passive range of motion of the neck. No evidence of RPA  or PTA. ?Eyes:  ?   General: No scleral icterus.    ?   Left eye: No discharge.  ?   Conjunctiva/sclera: Conjunctivae normal.  ?Cardiovascular:  ?   Rate and Rhythm: Normal rate and regular rhythm.  ?   Heart sounds: Normal heart sounds. No murmur heard. ?  No friction rub. No gallop.  ?Pulmonary:  ?   Effort: Pulmonary effort is normal. No respiratory distress.  ?   Breath sounds: Normal breath sounds.  ?Abdominal:  ?   General: Bowel sounds are normal. There is no distension.  ?   Palpations: Abdomen is soft.  ?   Tenderness: There is no abdominal tenderness. There is no guarding.  ?Musculoskeletal:     ?   General: Normal range of motion.  ?   Cervical back: Normal range of motion and neck supple.  ?Skin: ?   General: Skin is warm and dry.  ?   Findings: No rash.  ?Neurological:  ?   Mental Status: She is alert.  ?   Motor: No abnormal muscle tone.  ?   Coordination: Coordination normal.  ? ? ?ED Results / Procedures / Treatments   ?Labs ?(all labs ordered are listed, but only abnormal results are displayed) ?Labs Reviewed  ?GROUP A STREP BY PCR - Abnormal; Notable for the following components:  ?    Result Value  ? Group A Strep by PCR DETECTED (*)   ? All other components within normal limits  ?URINALYSIS, ROUTINE W REFLEX MICROSCOPIC - Abnormal; Notable for the following components:  ? Hgb urine dipstick LARGE (*)   ? Leukocytes,Ua TRACE (*)   ? All other components within normal limits  ?URINALYSIS, MICROSCOPIC (REFLEX) - Abnormal; Notable for the following components:  ? Bacteria, UA MANY (*)   ? All other components within normal limits  ?RESP PANEL BY RT-PCR (FLU A&B, COVID) [id number]  ? ? ?EKG ?None ? ?Radiology ?No results found. ? ?Procedures ?Procedures  ? ? ?Medications Ordered in ED ?Medications - No data to display ? ?ED Course/ Medical Decision Making/ A&P ?  ?                        ?Medical Decision Making ?Amount and/or Complexity of Data Reviewed ?Labs: ordered. ? ? ?Pt rapid strep test positive.  Pt is tolerating secretions, not in respiratory distress, no neck pain, no trismus. Presentation not concerning for peritonsillar abscess or spread of infection to deep spaces of the throat; patent airway. Pt will be discharged with amoxicillin and lidocaine to swish and spit.  Will give Robaxin for muscle pain. Ibuprofen or Tylenol as needed for pain/fever. Specific return precautions discussed. Recommended PCP follow up. Pt appears safe for discharge.  ? ? ? ?Patient is hemodynamically stable, in NAD, and able to ambulate in the ED. Evaluation  does not show pathology that would require ongoing emergent intervention or inpatient treatment. I explained the diagnosis to the patient. Pain has been managed and has no complaints prior to discharge. Patient is comfortable with above plan and is stable for discharge at this time. All questions were answered prior to disposition. Strict return precautions for returning to the ED were discussed. Encouraged follow up with PCP.  ? ?An After Visit Summary was printed and given to the patient. ? ? ?Portions of this note were generated with Scientist, clinical (histocompatibility and immunogenetics). Dictation errors may occur despite best attempts at proofreading. ? ? ? ? ? ? ? ? ?Final Clinical Impression(s) / ED Diagnoses ?Final diagnoses:  ?Strep pharyngitis  ? ? ?Rx / DC Orders ?ED Discharge Orders   ? ?      Ordered  ?  amoxicillin (AMOXIL) 500 MG capsule  Daily       ? 11/16/21 2113  ?  methocarbamol (ROBAXIN) 500 MG tablet  2 times daily       ? 11/16/21 2113  ?  lidocaine (XYLOCAINE) 2 % solution  As needed       ? 11/16/21 2113  ? ?  ?  ? ?  ? ? ?  ?Dietrich Pates, PA-C ?11/16/21 2118 ? ?  ?Maia Plan, MD ?11/24/21 [phone] ? ?

## 2021-11-16 NOTE — ED Notes (Signed)
Pt. Has been assessed by PA C and reports sore tht with upper resp. Symptoms.  Pt. Also reporting she is having urinary symptoms.  Pt. Does not want the PCN injection offered to her after accepting the offer.  Pt. Now wants the medication P.O. ?

## 2021-12-07 ENCOUNTER — Emergency Department (HOSPITAL_BASED_OUTPATIENT_CLINIC_OR_DEPARTMENT_OTHER)
Admission: EM | Admit: 2021-12-07 | Discharge: 2021-12-08 | Disposition: A | Discharge status: Home / Self Care | Payer: Medicaid Other | Attending: Emergency Medicine | Admitting: Emergency Medicine

## 2021-12-07 ENCOUNTER — Other Ambulatory Visit: Payer: Self-pay

## 2021-12-07 ENCOUNTER — Emergency Department (HOSPITAL_BASED_OUTPATIENT_CLINIC_OR_DEPARTMENT_OTHER): Payer: Medicaid Other

## 2021-12-07 ENCOUNTER — Encounter (HOSPITAL_BASED_OUTPATIENT_CLINIC_OR_DEPARTMENT_OTHER): Payer: Self-pay

## 2021-12-07 DIAGNOSIS — R1031 Right lower quadrant pain: Secondary | ICD-10-CM | POA: Insufficient documentation

## 2021-12-07 DIAGNOSIS — E039 Hypothyroidism, unspecified: Secondary | ICD-10-CM | POA: Insufficient documentation

## 2021-12-07 DIAGNOSIS — R1032 Left lower quadrant pain: Secondary | ICD-10-CM | POA: Insufficient documentation

## 2021-12-07 DIAGNOSIS — K5792 Diverticulitis of intestine, part unspecified, without perforation or abscess without bleeding: Secondary | ICD-10-CM

## 2021-12-07 LAB — CBC WITH DIFFERENTIAL/PLATELET
Abs Immature Granulocytes: 0.03 10*3/uL (ref 0.00–0.07)
Basophils Absolute: 0 10*3/uL (ref 0.0–0.1)
Basophils Relative: 1 %
Eosinophils Absolute: 0.3 10*3/uL (ref 0.0–0.5)
Eosinophils Relative: 3 %
HCT: 33.1 % — ABNORMAL LOW (ref 36.0–46.0)
Hemoglobin: 10.9 g/dL — ABNORMAL LOW (ref 12.0–15.0)
Immature Granulocytes: 0 %
Lymphocytes Relative: 24 %
Lymphs Abs: 2 10*3/uL (ref 0.7–4.0)
MCH: 28.2 pg (ref 26.0–34.0)
MCHC: 32.9 g/dL (ref 30.0–36.0)
MCV: 85.8 fL (ref 80.0–100.0)
Monocytes Absolute: 0.7 10*3/uL (ref 0.1–1.0)
Monocytes Relative: 8 %
Neutro Abs: 5.3 10*3/uL (ref 1.7–7.7)
Neutrophils Relative %: 64 %
Platelets: 247 10*3/uL (ref 150–400)
RBC: 3.86 MIL/uL — ABNORMAL LOW (ref 3.87–5.11)
RDW: 13 % (ref 11.5–15.5)
WBC: 8.3 10*3/uL (ref 4.0–10.5)
nRBC: 0 % (ref 0.0–0.2)

## 2021-12-07 LAB — COMPREHENSIVE METABOLIC PANEL
ALT: 29 U/L (ref 0–44)
AST: 28 U/L (ref 15–41)
Albumin: 3.5 g/dL (ref 3.5–5.0)
Alkaline Phosphatase: 72 U/L (ref 38–126)
Anion gap: 3 — ABNORMAL LOW (ref 5–15)
BUN: 17 mg/dL (ref 6–20)
CO2: 30 mmol/L (ref 22–32)
Calcium: 9.1 mg/dL (ref 8.9–10.3)
Chloride: 105 mmol/L (ref 98–111)
Creatinine, Ser: 0.79 mg/dL (ref 0.44–1.00)
GFR, Estimated: >60 mL/min (ref >60)
Glucose, Bld: 100 mg/dL — ABNORMAL HIGH (ref 70–99)
Potassium: 3.8 mmol/L (ref 3.5–5.1)
Sodium: 138 mmol/L (ref 135–145)
Total Bilirubin: 0.4 mg/dL (ref 0.3–1.2)
Total Protein: 8.1 g/dL (ref 6.5–8.1)

## 2021-12-07 LAB — URINALYSIS, ROUTINE W REFLEX MICROSCOPIC
Bilirubin Urine: NEGATIVE
Glucose, UA: NEGATIVE mg/dL
Ketones, ur: NEGATIVE mg/dL
Leukocytes,Ua: NEGATIVE
Nitrite: NEGATIVE
Protein, ur: NEGATIVE mg/dL
Specific Gravity, Urine: >=1.03 (ref 1.005–1.030)
pH: 5 (ref 5.0–8.0)

## 2021-12-07 LAB — URINALYSIS, MICROSCOPIC (REFLEX)

## 2021-12-07 LAB — LIPASE, BLOOD: Lipase: 37 U/L (ref 11–51)

## 2021-12-07 MED ORDER — ONDANSETRON HCL 4 MG/2ML IJ SOLN
4.0000 mg | Freq: Once | INTRAMUSCULAR | Status: AC
  Administered 2021-12-07: 4 mg via INTRAVENOUS
  Filled 2021-12-07: qty 2

## 2021-12-07 MED ORDER — IOHEXOL 300 MG/ML  SOLN
100.0000 mL | Freq: Once | INTRAMUSCULAR | Status: AC | PRN
  Administered 2021-12-07: 100 mL via INTRAVENOUS

## 2021-12-07 MED ORDER — MORPHINE SULFATE (PF) 4 MG/ML IV SOLN
4.0000 mg | Freq: Once | INTRAVENOUS | Status: AC
  Administered 2021-12-07: 4 mg via INTRAVENOUS
  Filled 2021-12-07: qty 1

## 2021-12-07 NOTE — ED Provider Notes (Signed)
?[facility] EMERGENCY DEPARTMENT ?Provider Note ? ? ?CSN: [id number] ?Arrival date & time: 12/07/21  2138 ? ?  ? ?History ? ?Chief Complaint  ?Patient presents with  ? Abdominal Pain  ? ? ?Jessica Fleming is a 56 y.o. female with history of obesity who presents with concern for generalized lower abdominal pain for the last 2 days, worse in the left lower quadrant relative to the right.  Pressure sensation in the abdomen with urination but otherwise has no associated symptoms.  No nausea, vomiting, diarrhea.  Normal bowel movement shortly prior to arrival though she states it was from her in texture than normal for her.  Denies fevers, chills, decreased appetite. ? ?Personally reviewed this patient medical records.  In addition to the above listed history she has history of hypercholesterolemia, B2 valve deficiency, GERD, and hypothyroidism on Synthroid. ? ?HPI ? ?  ? ?Home Medications ?Prior to Admission medications   ?Medication Sig Start Date End Date Taking? Authorizing Provider  ?atropine 1 % ophthalmic solution Place 1 drop into the left eye in the morning and at bedtime for 14 days. 06/24/20   [provider]  ?levothyroxine (SYNTHROID) 25 MCG tablet Take 25 mcg by mouth daily before breakfast.    [provider]  ?lidocaine (XYLOCAINE) 2 % solution Use as directed 15 mLs in the mouth or throat as needed for mouth pain. 11/16/21   Khatri, Hina, PA-C  ?methocarbamol (ROBAXIN) 500 MG tablet Take 1 tablet (500 mg total) by mouth 2 (two) times daily. 11/16/21   Khatri, Hina, PA-C  ?mycophenolate (CELLCEPT) 500 MG tablet Take by mouth. 07/19/20   [provider]  ?omeprazole (PRILOSEC) 20 MG capsule Take 1 capsule (20 mg total) by mouth daily. 02/13/20   Molpus, John, MD  ?oxyCODONE-acetaminophen (PERCOCET) 5-325 MG tablet Take 1 tablet by mouth every 4 (four) hours as needed. 12/31/20   Gilda Crease, MD  ?prednisoLONE acetate (PRED FORTE) 1 % ophthalmic suspension Place 1 drop  into the left eye 4 times daily. 06/24/20   [provider]  ?   ? ?Allergies    ?Fish oil, Hydrocodone bit-homatrop mbr, and Prednisone   ? ?Review of Systems   ?Review of Systems  ?Constitutional: Negative.   ?HENT: Negative.    ?Respiratory: Negative.    ?Cardiovascular: Negative.   ?Gastrointestinal:  Positive for abdominal pain. Negative for abdominal distention, anal bleeding, blood in stool, constipation, diarrhea, nausea, rectal pain and vomiting.  ?Genitourinary:  Negative for decreased urine volume, difficulty urinating, dysuria, enuresis, urgency, vaginal bleeding, vaginal discharge and vaginal pain.  ?Musculoskeletal: Negative.   ?Skin: Negative.   ?Neurological: Negative.   ?Hematological: Negative.   ? ?Physical Exam ?Updated Vital Signs ?BP 117/61 (BP Location: Left Arm)   Pulse 79   Temp 98.3 ?F (36.8 ?C) (Oral)   Resp 20   Ht 5\' 1"  (1.549 m)   Wt 97.5 kg   LMP 10/01/2017   SpO2 96%   BMI 40.62 kg/m?  ?Physical Exam ?Vitals and nursing note reviewed.  ?Constitutional:   ?   Appearance: She is obese. She is not ill-appearing or toxic-appearing.  ?HENT:  ?   Head: Normocephalic and atraumatic.  ?   Mouth/Throat:  ?   Mouth: Mucous membranes are moist.  ?   Pharynx: No oropharyngeal exudate or posterior oropharyngeal erythema.  ?Eyes:  ?   General:     ?   Right eye: No discharge.     ?   Left eye:  No discharge.  ?   Extraocular Movements: Extraocular movements intact.  ?   Conjunctiva/sclera: Conjunctivae normal.  ?   Pupils: Pupils are equal, round, and reactive to light.  ?Cardiovascular:  ?   Rate and Rhythm: Normal rate and regular rhythm.  ?   Pulses: Normal pulses.  ?   Heart sounds: Normal heart sounds. No murmur heard. ?Pulmonary:  ?   Effort: Pulmonary effort is normal. No respiratory distress.  ?   Breath sounds: Normal breath sounds. No wheezing or rales.  ?Abdominal:  ?   General: Bowel sounds are normal. There is no distension.  ?   Palpations: Abdomen is soft.  ?    Tenderness: There is abdominal tenderness in the right lower quadrant, suprapubic area and left lower quadrant. There is no right CVA tenderness, left CVA tenderness, guarding or rebound.  ?   Comments: TTP L>R lower quadrant  ?Musculoskeletal:     ?   General: No deformity.  ?   Cervical back: Neck supple.  ?Skin: ?   General: Skin is warm and dry.  ?   Capillary Refill: Capillary refill takes less than 2 seconds.  ?Neurological:  ?   General: No focal deficit present.  ?   Mental Status: She is alert and oriented to person, place, and time. Mental status is at baseline.  ?Psychiatric:     ?   Mood and Affect: Mood normal.  ? ? ?ED Results / Procedures / Treatments   ?Labs ?(all labs ordered are listed, but only abnormal results are displayed) ?Labs Reviewed - No data to display ? ?EKG ?None ? ?Radiology ?No results found. ? ?Procedures ?Procedures  ? ?Medications Ordered in ED ?Medications - No data to display ? ?ED Course/ Medical Decision Making/ A&P ?  ?                        ?Medical Decision Making ?56 year old female with 2 days of lower abdominal pain without associated symptoms but with mild pressure in the abdomen with urination. ? ?Vital signs are normal intake.  Cardiopulmonary exam is normal, abdominal exam is significant for bilateral lower quadrant tenderness palpation, left greater than right without rebound or guarding.  No CVA tenderness.  Neurovascular intact in all 4 extremities. ? ?Differential diagnosis broad includes but is limited to UTI/pyelonephritis, ureterolithiasis/nephrolithiasis,diverticulitis,appendicitis, mesenteric ischemia. ? ? ?Amount and/or Complexity of Data Reviewed ?Labs: ordered. ?   Details: CBC without leukocytosis but with mild anemia with hemoglobin of 10.9 near patient's baseline hemoglobin of 11. ? ?Risk ?Prescription drug management. ? ? ?All other laboratory studies and CT of the abdomen pelvis is pending at this time.  Care of this patient signed out to oncoming ED  provider Dr. Bernette Mayers at time of shift change.  All pertinent HPI, physical exam, laboratory findings were discussed with him prior to my departure.  ? ?Robin voiced understanding of her medical evaluation and treatment plan thus far. Each of her questions was answered to her expressed satisfaction. Dispo pending workup.  ? ?This chart was dictated using voice recognition software, Dragon. Despite the best efforts of this provider to proofread and correct errors, errors may still occur which can change documentation meaning. ? ?Final Clinical Impression(s) / ED Diagnoses ?Final diagnoses:  ?None  ? ? ?Rx / DC Orders ?ED Discharge Orders   ? ? None  ? ?  ? ? ?  ?Paris Lore, PA-C ?12/07/21 2334 ? ?  ?Meridee Score  C, MD ?12/08/21 1153 ? ?

## 2021-12-07 NOTE — ED Triage Notes (Signed)
Pt c/o lower abd pain day 2-denies n/v/d, urinary sx , vaginal d/c and bowel changes-NAD-steady gait ?

## 2021-12-07 NOTE — ED Notes (Signed)
Patient states pain worse after attempting bowel movement. ?

## 2021-12-08 MED ORDER — AMOXICILLIN-POT CLAVULANATE 875-125 MG PO TABS
1.0000 | ORAL_TABLET | Freq: Once | ORAL | Status: AC
  Administered 2021-12-08: 1 via ORAL
  Filled 2021-12-08: qty 1

## 2021-12-08 MED ORDER — AMOXICILLIN-POT CLAVULANATE 875-125 MG PO TABS: 1.0000 | ORAL_TABLET | Freq: Two times a day (BID) | ORAL | 0 refills | Status: AC

## 2021-12-08 NOTE — ED Provider Notes (Signed)
Care of the patient assumed at the change of shift. Pending CT for LLQ abdominal pain.  ?Physical Exam  ?BP 117/61 (BP Location: Left Arm)   Pulse 79   Temp 98.3 ?F (36.8 ?C) (Oral)   Resp 20   Ht 5\' 1"  (1.549 m)   Wt 97.5 kg   LMP 10/01/2017   SpO2 96%   BMI 40.62 kg/m?  ? ?Physical Exam ? ?Procedures  ?Procedures ? ?ED Course / MDM  ? ?Clinical Course as of 12/08/21 0033  ?Fri Dec 08, 2021  ?[id number] CT shows uncomplicated mild diverticulitis. Plan d/c with Augmentin and PCP follow up. RTED for any worsening or other concerns.  [CS]  ?  ?Clinical Course User Index ?[CS] Truddie Hidden, MD  ? ?Medical Decision Making ?Problems Addressed: ?Diverticulitis: acute illness or injury ? ?Amount and/or Complexity of Data Reviewed ?Labs: ordered. ?Radiology: ordered and independent interpretation performed. Decision-making details documented in ED Course. ? ?Risk ?Prescription drug management. ? ? ? ? ? ? ? ?  ?Truddie Hidden, MD ?12/08/21 [id number] ? ?

## 2022-08-28 ENCOUNTER — Emergency Department (HOSPITAL_BASED_OUTPATIENT_CLINIC_OR_DEPARTMENT_OTHER): Payer: Medicaid Other

## 2022-08-28 ENCOUNTER — Other Ambulatory Visit: Payer: Self-pay

## 2022-08-28 ENCOUNTER — Emergency Department (HOSPITAL_BASED_OUTPATIENT_CLINIC_OR_DEPARTMENT_OTHER)
Admission: EM | Admit: 2022-08-28 | Discharge: 2022-08-28 | Disposition: A | Discharge status: Home / Self Care | Payer: Medicaid Other | Attending: Emergency Medicine | Admitting: Emergency Medicine

## 2022-08-28 ENCOUNTER — Encounter (HOSPITAL_BASED_OUTPATIENT_CLINIC_OR_DEPARTMENT_OTHER): Payer: Self-pay

## 2022-08-28 DIAGNOSIS — R509 Fever, unspecified: Secondary | ICD-10-CM | POA: Insufficient documentation

## 2022-08-28 DIAGNOSIS — R059 Cough, unspecified: Secondary | ICD-10-CM | POA: Diagnosis not present

## 2022-08-28 DIAGNOSIS — J069 Acute upper respiratory infection, unspecified: Secondary | ICD-10-CM

## 2022-08-28 DIAGNOSIS — M549 Dorsalgia, unspecified: Secondary | ICD-10-CM | POA: Insufficient documentation

## 2022-08-28 DIAGNOSIS — Z1152 Encounter for screening for COVID-19: Secondary | ICD-10-CM | POA: Insufficient documentation

## 2022-08-28 LAB — RESP PANEL BY RT-PCR (RSV, FLU A&B, COVID)  RVPGX2
Influenza A by PCR: NEGATIVE
Influenza B by PCR: NEGATIVE
Resp Syncytial Virus by PCR: NEGATIVE
SARS Coronavirus 2 by RT PCR: NEGATIVE

## 2022-08-28 MED ORDER — KETOROLAC TROMETHAMINE 60 MG/2ML IM SOLN
60.0000 mg | Freq: Once | INTRAMUSCULAR | Status: AC
  Administered 2022-08-28: 60 mg via INTRAMUSCULAR
  Filled 2022-08-28: qty 2

## 2022-08-28 NOTE — ED Provider Notes (Signed)
[facility] EMERGENCY DEPARTMENT Provider Note   CSN: [id number] Arrival date & time: 08/28/22  1010     History  Chief Complaint  Patient presents with   URI    Jessica Fleming is a 56 y.o. female. Past Medical History:  Diagnosis Date   GERD (gastroesophageal reflux disease)    Morbid obesity (HCC)    Thyroid disease      URI Patient endorses body aches and fever for a few days. Coughing up clear mucus and back pain. Using robitussin. Household sick last week. Same symptoms began about 12 days ago and lasted 5 days - improved but did not completely resolve before worsening again. Denies shortness of breath currently. Endorses central chest pain when she coughs but not otherwise. Has taken tylenol and ibuprofen without relief. Endorses constant sharp back pain diffusely. No muscle aches anywhere else. Symptoms are not worse on exertion. Feels weak. Endorses subjective fever and chills at home. No dysuria. Denies d/n/v. Denies neck pain or stiffness.     Home Medications Prior to Admission medications   Medication Sig Start Date End Date Taking? Authorizing Provider  atropine 1 % ophthalmic solution Place 1 drop into the left eye in the morning and at bedtime for 14 days. 06/24/20   [provider]  levothyroxine (SYNTHROID) 25 MCG tablet Take 25 mcg by mouth daily before breakfast.    [provider]  lidocaine (XYLOCAINE) 2 % solution Use as directed 15 mLs in the mouth or throat as needed for mouth pain. 11/16/21   Khatri, Hina, PA-C  methocarbamol (ROBAXIN) 500 MG tablet Take 1 tablet (500 mg total) by mouth 2 (two) times daily. 11/16/21   Khatri, Hina, PA-C  mycophenolate (CELLCEPT) 500 MG tablet Take by mouth. 07/19/20   [provider]  omeprazole (PRILOSEC) 20 MG capsule Take 1 capsule (20 mg total) by mouth daily. 02/13/20   Molpus, John, MD  oxyCODONE-acetaminophen (PERCOCET) 5-325 MG tablet Take 1 tablet by mouth every 4 (four) hours as  needed. 12/31/20   Gilda Crease, MD  prednisoLONE acetate (PRED FORTE) 1 % ophthalmic suspension Place 1 drop into the left eye 4 times daily. 06/24/20   [provider]      Allergies    Fish oil, Hydrocodone bit-homatrop mbr, and Prednisone    Review of Systems   Review of Systems  Physical Exam Updated Vital Signs BP (!) 153/76 (BP Location: Right Arm)   Pulse 79   Temp 98.6 F (37 C) (Oral)   Resp 18   Ht 5\' 1"  (1.549 m)   Wt 90.7 kg   LMP 10/01/2017   SpO2 98%   BMI 37.79 kg/m  Physical Exam Constitutional:      General: She is not in acute distress.    Appearance: She is ill-appearing.  HENT:     Head: Normocephalic and atraumatic.     Mouth/Throat:     Mouth: Mucous membranes are moist.     Pharynx: No oropharyngeal exudate or posterior oropharyngeal erythema.  Eyes:     Extraocular Movements: Extraocular movements intact.  Cardiovascular:     Rate and Rhythm: Normal rate and regular rhythm.     Pulses: Normal pulses.  Pulmonary:     Effort: Pulmonary effort is normal.     Breath sounds: Normal breath sounds. No wheezing, rhonchi or rales.  Musculoskeletal:     Cervical back: Neck supple.  Skin:    General: Skin is warm and dry.  Neurological:  General: No focal deficit present.     Mental Status: She is alert and oriented to person, place, and time.  Psychiatric:        Mood and Affect: Mood normal.        Behavior: Behavior normal.     ED Results / Procedures / Treatments   Labs (all labs ordered are listed, but only abnormal results are displayed) Labs Reviewed  RESP PANEL BY RT-PCR (RSV, FLU A&B, COVID)  [id number]    EKG None  Radiology No results found.  Procedures Procedures    Medications Ordered in ED Medications - No data to display  ED Course/ Medical Decision Making/ A&P                           Medical Decision Making Amount and/or Complexity of Data Reviewed Radiology: ordered.  Risk Prescription  drug management.   Patient presents with subjective fevers, chills, back pain, and cough. Patient had similar symptoms last week which improved for a few days and then have been worsening again since yesterday. Does not appear septic - afebrile and mildly hypertensive. Oxygenating well. Lung exam non-concerning. Respiratory panel negative for flu/covid. CXR wnl. Will provide Toradol injection for myalgias. Discussed return precautions.        Final Clinical Impression(s) / ED Diagnoses Final diagnoses:  None    Rx / DC Orders ED Discharge Orders     None         Adron Bene, MD 08/28/22 1524    Gwyneth Sprout, MD 08/29/22 2209

## 2022-08-28 NOTE — ED Triage Notes (Signed)
C/o cough, bodyaches, fever, headache, fatigue since yesterday. Last took tylenol 1 hour pta.

## 2022-08-28 NOTE — Discharge Instructions (Addendum)
You came in with viral respiratory symptoms and muscle aches. Chest xray did not show pneumonia. Nasal swab was negative for covid and flu. Your symptoms should improve but if they are not better by next week, please return to clinic to be seen. If you have chest pain, stomach pain/nausea/diarrhea, or worsening of other symptoms, please seek medical attention.

## 2023-01-25 ENCOUNTER — Encounter (HOSPITAL_BASED_OUTPATIENT_CLINIC_OR_DEPARTMENT_OTHER): Payer: Self-pay

## 2023-01-25 ENCOUNTER — Other Ambulatory Visit: Payer: Self-pay

## 2023-01-25 ENCOUNTER — Emergency Department (HOSPITAL_BASED_OUTPATIENT_CLINIC_OR_DEPARTMENT_OTHER)
Admission: EM | Admit: 2023-01-25 | Discharge: 2023-01-25 | Disposition: A | Discharge status: Home / Self Care | Payer: Medicaid Other | Attending: Emergency Medicine | Admitting: Emergency Medicine

## 2023-01-25 ENCOUNTER — Emergency Department (HOSPITAL_BASED_OUTPATIENT_CLINIC_OR_DEPARTMENT_OTHER): Payer: Medicaid Other

## 2023-01-25 DIAGNOSIS — M545 Low back pain, unspecified: Secondary | ICD-10-CM | POA: Insufficient documentation

## 2023-01-25 DIAGNOSIS — R1032 Left lower quadrant pain: Secondary | ICD-10-CM | POA: Diagnosis not present

## 2023-01-25 LAB — COMPREHENSIVE METABOLIC PANEL
ALT: 29 U/L (ref 0–44)
AST: 31 U/L (ref 15–41)
Albumin: 3.5 g/dL (ref 3.5–5.0)
Alkaline Phosphatase: 75 U/L (ref 38–126)
Anion gap: 9 (ref 5–15)
BUN: 21 mg/dL — ABNORMAL HIGH (ref 6–20)
CO2: 25 mmol/L (ref 22–32)
Calcium: 8.4 mg/dL — ABNORMAL LOW (ref 8.9–10.3)
Chloride: 103 mmol/L (ref 98–111)
Creatinine, Ser: 0.88 mg/dL (ref 0.44–1.00)
GFR, Estimated: >60 mL/min (ref >60)
Glucose, Bld: 112 mg/dL — ABNORMAL HIGH (ref 70–99)
Potassium: 3.8 mmol/L (ref 3.5–5.1)
Sodium: 137 mmol/L (ref 135–145)
Total Bilirubin: 0.2 mg/dL — ABNORMAL LOW (ref 0.3–1.2)
Total Protein: 7.6 g/dL (ref 6.5–8.1)

## 2023-01-25 LAB — CBC WITH DIFFERENTIAL/PLATELET
Abs Immature Granulocytes: 0.02 10*3/uL (ref 0.00–0.07)
Basophils Absolute: 0.1 10*3/uL (ref 0.0–0.1)
Basophils Relative: 1 %
Eosinophils Absolute: 0.3 10*3/uL (ref 0.0–0.5)
Eosinophils Relative: 3 %
HCT: 32.8 % — ABNORMAL LOW (ref 36.0–46.0)
Hemoglobin: 10.8 g/dL — ABNORMAL LOW (ref 12.0–15.0)
Immature Granulocytes: 0 %
Lymphocytes Relative: 25 %
Lymphs Abs: 2.2 10*3/uL (ref 0.7–4.0)
MCH: 28.2 pg (ref 26.0–34.0)
MCHC: 32.9 g/dL (ref 30.0–36.0)
MCV: 85.6 fL (ref 80.0–100.0)
Monocytes Absolute: 0.8 10*3/uL (ref 0.1–1.0)
Monocytes Relative: 9 %
Neutro Abs: 5.4 10*3/uL (ref 1.7–7.7)
Neutrophils Relative %: 62 %
Platelets: 217 10*3/uL (ref 150–400)
RBC: 3.83 MIL/uL — ABNORMAL LOW (ref 3.87–5.11)
RDW: 12.8 % (ref 11.5–15.5)
WBC: 8.7 10*3/uL (ref 4.0–10.5)
nRBC: 0 % (ref 0.0–0.2)

## 2023-01-25 LAB — URINALYSIS, ROUTINE W REFLEX MICROSCOPIC
Bilirubin Urine: NEGATIVE
Glucose, UA: NEGATIVE mg/dL
Ketones, ur: NEGATIVE mg/dL
Nitrite: NEGATIVE
Protein, ur: NEGATIVE mg/dL
Specific Gravity, Urine: 1.015 (ref 1.005–1.030)
pH: 7 (ref 5.0–8.0)

## 2023-01-25 LAB — URINALYSIS, MICROSCOPIC (REFLEX)

## 2023-01-25 MED ORDER — SODIUM CHLORIDE 0.9 % IV BOLUS
1000.0000 mL | Freq: Once | INTRAVENOUS | Status: AC
  Administered 2023-01-25: 1000 mL via INTRAVENOUS

## 2023-01-25 MED ORDER — CYCLOBENZAPRINE HCL 5 MG PO TABS
5.0000 mg | ORAL_TABLET | Freq: Once | ORAL | Status: AC
  Administered 2023-01-25: 5 mg via ORAL
  Filled 2023-01-25: qty 1

## 2023-01-25 MED ORDER — FLUCONAZOLE 150 MG PO TABS
150.0000 mg | ORAL_TABLET | Freq: Once | ORAL | Status: AC
  Administered 2023-01-25: 150 mg via ORAL
  Filled 2023-01-25: qty 1

## 2023-01-25 MED ORDER — FENTANYL CITRATE PF 50 MCG/ML IJ SOSY
100.0000 ug | PREFILLED_SYRINGE | Freq: Once | INTRAMUSCULAR | Status: AC
  Administered 2023-01-25: 100 ug via INTRAVENOUS
  Filled 2023-01-25: qty 2

## 2023-01-25 MED ORDER — CYCLOBENZAPRINE HCL 5 MG PO TABS: 5.0000 mg | ORAL_TABLET | Freq: Two times a day (BID) | ORAL | 0 refills | Status: AC | PRN

## 2023-01-25 MED ORDER — IOHEXOL 300 MG/ML  SOLN
100.0000 mL | Freq: Once | INTRAMUSCULAR | Status: AC | PRN
  Administered 2023-01-25: 100 mL via INTRAVENOUS

## 2023-01-25 NOTE — ED Triage Notes (Signed)
L sided back pain since this morning. Pt fell backwards at work 2 weeks ago but pain is getting worse.

## 2023-01-25 NOTE — ED Provider Notes (Signed)
[place] EMERGENCY DEPARTMENT AT [facility] Provider Note   CSN: [id number] Arrival date & time: 01/25/23  2032     History  Chief Complaint  Patient presents with   Back Pain    Jessica Fleming is a 57 y.o. female, who presents to the ED secondary to left lower quadrant pain radiating to the low back, it has been going on for the past day.  All history obtained via family member, offered interpreter, but they declined.  Per family member, patient fell about 2 weeks ago landed on her back, and was taking ibuprofen and Tylenol, with some relief of her pain.  She states that she was getting by with this, and was told it is just like a muscle strain,, but today she woke up and she started having severe pain, that radiated to her left lower quadrant.  She states that she has been moaning in pain all day and been in distress.  Has been taking the ibuprofen and Tylenol without relief.  Denies any nausea, vomiting, chest pain, shortness of breath.     Home Medications Prior to Admission medications   Medication Sig Start Date End Date Taking? Authorizing Provider  cyclobenzaprine (FLEXERIL) 5 MG tablet Take 1 tablet (5 mg total) by mouth 2 (two) times daily as needed for muscle spasms. 01/25/23  Yes Fabiha Rougeau L, PA  atropine 1 % ophthalmic solution Place 1 drop into the left eye in the morning and at bedtime for 14 days. 06/24/20   [provider]  levothyroxine (SYNTHROID) 25 MCG tablet Take 25 mcg by mouth daily before breakfast.    [provider]  lidocaine (XYLOCAINE) 2 % solution Use as directed 15 mLs in the mouth or throat as needed for mouth pain. 11/16/21   Khatri, Hina, PA-C  methocarbamol (ROBAXIN) 500 MG tablet Take 1 tablet (500 mg total) by mouth 2 (two) times daily. 11/16/21   Khatri, Hina, PA-C  mycophenolate (CELLCEPT) 500 MG tablet Take by mouth. 07/19/20   [provider]  omeprazole (PRILOSEC) 20 MG capsule Take 1 capsule (20 mg total) by  mouth daily. 02/13/20   Molpus, John, MD  oxyCODONE-acetaminophen (PERCOCET) 5-325 MG tablet Take 1 tablet by mouth every 4 (four) hours as needed. 12/31/20   Gilda Crease, MD  prednisoLONE acetate (PRED FORTE) 1 % ophthalmic suspension Place 1 drop into the left eye 4 times daily. 06/24/20   [provider]      Allergies    Fish oil, Hydrocodone bit-homatrop mbr, and Prednisone    Review of Systems   Review of Systems  Gastrointestinal:  Positive for abdominal pain. Negative for constipation, nausea and vomiting.  Musculoskeletal:  Positive for back pain.    Physical Exam Updated Vital Signs BP 138/73 (BP Location: Left Arm)   Pulse 73   Temp 98.2 F (36.8 C) (Oral)   Resp 20   Ht 5\' 1"  (1.549 m)   Wt 95.3 kg   LMP 10/01/2017   SpO2 97%   BMI 39.68 kg/m  Physical Exam Vitals and nursing note reviewed.  Constitutional:      General: She is in acute distress.     Appearance: She is well-developed.  HENT:     Head: Normocephalic and atraumatic.  Eyes:     Conjunctiva/sclera: Conjunctivae normal.  Cardiovascular:     Rate and Rhythm: Normal rate and regular rhythm.     Heart sounds: No murmur heard. Pulmonary:     Effort: Pulmonary  effort is normal. No respiratory distress.     Breath sounds: Normal breath sounds.  Abdominal:     Palpations: Abdomen is soft.     Tenderness: There is abdominal tenderness in the left lower quadrant.  Musculoskeletal:        General: No swelling.     Cervical back: Neck supple.     Comments: Tenderness to palpation of left flank, no evidence of any kind of rash.  Negative CVA tenderness.  Skin:    General: Skin is warm and dry.     Capillary Refill: Capillary refill takes less than 2 seconds.  Neurological:     Mental Status: She is alert.  Psychiatric:        Mood and Affect: Mood normal.     ED Results / Procedures / Treatments   Labs (all labs ordered are listed, but only abnormal results are displayed) Labs  Reviewed  CBC WITH DIFFERENTIAL/PLATELET - Abnormal; Notable for the following components:      Result Value   RBC 3.83 (*)    Hemoglobin 10.8 (*)    HCT 32.8 (*)    All other components within normal limits  COMPREHENSIVE METABOLIC PANEL - Abnormal; Notable for the following components:   Glucose, Bld 112 (*)    BUN 21 (*)    Calcium 8.4 (*)    Total Bilirubin 0.2 (*)    All other components within normal limits  URINALYSIS, ROUTINE W REFLEX MICROSCOPIC - Abnormal; Notable for the following components:   Hgb urine dipstick MODERATE (*)    Leukocytes,Ua MODERATE (*)    All other components within normal limits  URINALYSIS, MICROSCOPIC (REFLEX) - Abnormal; Notable for the following components:   Bacteria, UA RARE (*)    All other components within normal limits  URINE CULTURE    EKG None  Radiology CT L-SPINE NO CHARGE  Result Date: 01/25/2023 CLINICAL DATA:  Left-sided back pain EXAM: CT LUMBAR SPINE WITHOUT CONTRAST TECHNIQUE: Multidetector CT imaging of the lumbar spine was performed without intravenous contrast administration. Multiplanar CT image reconstructions were also generated. RADIATION DOSE REDUCTION: This exam was performed according to the departmental dose-optimization program which includes automated exposure control, adjustment of the mA and/or kV according to patient size and/or use of iterative reconstruction technique. COMPARISON:  None Available. FINDINGS: Segmentation: 5 lumbar type vertebrae. Alignment: Normal. Vertebrae: No acute fracture or focal pathologic process. Paraspinal and other soft tissues: Negative. Disc levels: No spinal canal stenosis. IMPRESSION: No acute fracture or static subluxation of the lumbar spine. Electronically Signed   By: Deatra Robinson M.D.   On: 01/25/2023 23:07   CT ABDOMEN PELVIS W CONTRAST  Result Date: 01/25/2023 CLINICAL DATA:  Left lower quadrant abdominal pain EXAM: CT ABDOMEN AND PELVIS WITH CONTRAST TECHNIQUE: Multidetector  CT imaging of the abdomen and pelvis was performed using the standard protocol following bolus administration of intravenous contrast. RADIATION DOSE REDUCTION: This exam was performed according to the departmental dose-optimization program which includes automated exposure control, adjustment of the mA and/or kV according to patient size and/or use of iterative reconstruction technique. CONTRAST:  OMNIPAQUE IOHEXOL 300 MG/ML  SOLN COMPARISON:  None Available. FINDINGS: Lower Chest: Normal. Hepatobiliary: Normal hepatic contours. No intra- or extrahepatic biliary dilatation. The gallbladder is normal. Pancreas: Normal pancreas. No ductal dilatation or peripancreatic fluid collection. Spleen: Normal. Adrenals/Urinary Tract: The adrenal glands are normal. No hydronephrosis, nephroureterolithiasis or solid renal mass. The urinary bladder is normal for degree of distention Stomach/Bowel: There is  no hiatal hernia. Normal duodenal course and caliber. No Acelin Ferdig bowel dilatation or inflammation. No focal colonic abnormality. Normal appendix. Vascular/Lymphatic: Normal course and caliber of the major abdominal vessels. No abdominal or pelvic lymphadenopathy. Reproductive: Normal uterus. No adnexal mass. Other: None. Musculoskeletal: No bony spinal canal stenosis or focal osseous abnormality. IMPRESSION: No acute abnormality of the abdomen or pelvis. Electronically Signed   By: Deatra Robinson M.D.   On: 01/25/2023 23:05    Procedures Procedures    Medications Ordered in ED Medications  fluconazole (DIFLUCAN) tablet 150 mg (has no administration in time range)  fentaNYL (SUBLIMAZE) injection 100 mcg (100 mcg Intravenous Given 01/25/23 2115)  cyclobenzaprine (FLEXERIL) tablet 5 mg (5 mg Oral Given 01/25/23 2139)  sodium chloride 0.9 % bolus 1,000 mL (0 mLs Intravenous Stopped 01/25/23 2323)  iohexol (OMNIPAQUE) 300 MG/ML solution 100 mL (100 mLs Intravenous Contrast Given 01/25/23 2233)    ED Course/ Medical  Decision Making/ A&P                             Medical Decision Making Patient is a 57 year old female, here for left lower quadrant pain radiating to the back it has been going on for the last day.  She states she had trauma about 2 weeks ago.  She does have tenderness to palpation of the left lower quadrant, and the left flank.  Will obtain a CT abdomen pelvis, for this as well as urinalysis and L-spine for evaluation for possible fracture, intra-abdominal etiology.  Fentanyl for pain control.  Amount and/or Complexity of Data Reviewed Labs: ordered.    Details: No leukocytosis, does have some blood in the urine, as well as yeast, and a moderate amount of leukocytes.  Radiology: ordered.    Details: CT abdomen pelvis, L-spine within normal limits Discussion of management or test interpretation with external provider(s): Discussed with patient, she is feeling much better, we will send her home with Flexeril, as she probably had a muscle spasm, which resulted in the left flank pain, her CT abdomen pelvis is unremarkable, and her L-spine is unremarkable as well.  Discharged with strict return precautions follow-up with PCP.  She has no chest pain, shortness of breath  Risk Prescription drug management.   Final Clinical Impression(s) / ED Diagnoses Final diagnoses:  Acute left-sided low back pain without sciatica  Left lower quadrant pain    Rx / DC Orders ED Discharge Orders          Ordered    cyclobenzaprine (FLEXERIL) 5 MG tablet  2 times daily PRN        01/25/23 2325              Paisly Fingerhut, Harley Alto, PA 01/25/23 2330    Benjiman Core, MD 01/26/23 1441

## 2023-01-25 NOTE — Discharge Instructions (Addendum)
Please follow-up with your primary care doctor return to ER if you have worsening pain, intractable nausea, vomiting, chills, urinary symptoms.  We are culturing your urine just to make sure you do not have a urinary tract infection.  Use ibuprofen and Tylenol for your pain control.

## 2023-01-27 LAB — URINE CULTURE

## 2023-01-28 LAB — URINE CULTURE: Culture: 10000 — AB

## 2023-01-29 ENCOUNTER — Telehealth (HOSPITAL_BASED_OUTPATIENT_CLINIC_OR_DEPARTMENT_OTHER): Payer: Self-pay | Admitting: *Deleted

## 2023-01-29 NOTE — Progress Notes (Signed)
ED Antimicrobial Stewardship Positive Culture Follow Up   Ayssa Krenz is an 57 y.o. female who presented to [facility] on 01/25/2023 with a chief complaint of LLQ pain, radiating from her lower back on the same side. Patient reported falling 2 weeks prior to presentation onto her back and had been experiencing pain since that time, which was relieved by APAP and IBU. At time of her presentation it was believed she was experiencing back spasms for which she was prescribed flexeril.   Recent Results (from the past 720 hour(s))  Urine Culture     Status: Abnormal   Collection Time: 01/25/23  9:14 PM   Specimen: Urine, Clean Catch  Result Value Ref Range Status   Specimen Description   Final    URINE, CLEAN CATCH Performed at [facility], [address] [place], [place] [address]    Special Requests   Final    NONE Performed at [facility], [address] [place], [place] [address]    Culture (A)  Final    10,000 COLONIES/mL GROUP A STREP (S.PYOGENES) ISOLATED Beta hemolytic streptococci are predictably susceptible to penicillin and other beta lactams. Susceptibility testing not routinely performed. Performed at [facility], [address], [place], [place] [address]    Report Status 01/28/2023 FINAL  Final    Patient discharged without antimicrobial agent and NO treatment is indicated at this time.   New antibiotic prescription: No antibiotics are indicated   ED Provider: Glyn Ade, MD    Jani Gravel, PharmD PGY-2 Infectious Diseases Resident  01/29/2023 9:30 AM

## 2023-01-29 NOTE — Telephone Encounter (Signed)
Post ED Visit - Positive Culture Follow-up  Culture report reviewed by antimicrobial stewardship pharmacist: [facility] [x] [address] Pharm.D. []  Celedonio Miyamoto, [address].D., BCPS AQ-ID []  Garvin Fila, Pharm.D., BCPS []  Georgina Pillion, [address]D., BCPS []  [place], [address]D., BCPS, AAHIVP []  Estella Husk, Pharm.D., BCPS, AAHIVP []  Lysle Pearl, PharmD, BCPS []  Phillips Climes, PharmD, BCPS []  Agapito Games, PharmD, BCPS []  Verlan Friends, PharmD []  Mervyn Gay, PharmD, BCPS []  Vinnie Level, PharmD  [facility] []  Len Childs, PharmD []  Greer Pickerel, PharmD []  Adalberto Cole, PharmD []  Perlie Gold, Rph []  Lonell Face) Jean Rosenthal, PharmD []  Earl Many, PharmD []  Junita Push, PharmD []  Dorna Leitz, PharmD []  Terrilee Files, PharmD []  Lynann Beaver, PharmD []  Keturah Barre, PharmD []  Loralee Pacas, PharmD []  Bernadene Person, PharmD   Positive urine culture Plan: No abx needed. No urinary symptoms reported, No changes need.  Bing Quarry 01/29/2023, 11:46 AM

## 2023-03-28 ENCOUNTER — Other Ambulatory Visit: Payer: Self-pay

## 2023-03-28 ENCOUNTER — Encounter (HOSPITAL_BASED_OUTPATIENT_CLINIC_OR_DEPARTMENT_OTHER): Payer: Self-pay | Admitting: Emergency Medicine

## 2023-03-28 ENCOUNTER — Emergency Department (HOSPITAL_BASED_OUTPATIENT_CLINIC_OR_DEPARTMENT_OTHER): Payer: Medicaid Other

## 2023-03-28 ENCOUNTER — Emergency Department (HOSPITAL_BASED_OUTPATIENT_CLINIC_OR_DEPARTMENT_OTHER)
Admission: EM | Admit: 2023-03-28 | Discharge: 2023-03-28 | Disposition: A | Discharge status: Home / Self Care | Payer: Medicaid Other | Attending: Emergency Medicine | Admitting: Emergency Medicine

## 2023-03-28 DIAGNOSIS — M25562 Pain in left knee: Secondary | ICD-10-CM | POA: Diagnosis present

## 2023-03-28 DIAGNOSIS — M1712 Unilateral primary osteoarthritis, left knee: Secondary | ICD-10-CM | POA: Insufficient documentation

## 2023-03-28 MED ORDER — IBUPROFEN 800 MG PO TABS: 800.0000 mg | ORAL_TABLET | Freq: Three times a day (TID) | ORAL | 0 refills | Status: AC

## 2023-03-28 MED ORDER — IBUPROFEN 800 MG PO TABS
800.0000 mg | ORAL_TABLET | Freq: Once | ORAL | Status: AC
  Administered 2023-03-28: 800 mg via ORAL
  Filled 2023-03-28: qty 1

## 2023-03-28 NOTE — ED Triage Notes (Signed)
Pt reports left medial knee pain x 1 week, believes it is arthritis pain, reports stiffness and difficulty sleeping and walking, has Celebrex but states it has not been helping

## 2023-03-28 NOTE — ED Provider Notes (Signed)
[facility] Provider Note   CSN: [id number] Arrival date & time: 03/28/23  1258     History  Chief Complaint  Patient presents with   Knee Pain    Jessica Fleming is a 57 y.o. female history of obesity, hypercholesterolemia and thyroid disease who presents the ED today, with her husband at bedside, for left knee pain. Patient reports pain to the medial left knee for the past week. Pain is worse in the morning and when she has to bare weight. She has a history of left knee pain in the past but reports that the intensity of the pain has worsened in the past week.  Patient is unable to get into see her primary provider for the next week or so, which led her to present here today.  She has no other complaints or concerns at this time.    Home Medications Prior to Admission medications   Medication Sig Start Date End Date Taking? Authorizing Provider  ibuprofen (ADVIL) 800 MG tablet Take 1 tablet (800 mg total) by mouth 3 (three) times daily. 03/28/23 04/27/23 Yes Maxwell Marion, PA-C  atropine 1 % ophthalmic solution Place 1 drop into the left eye in the morning and at bedtime for 14 days. 06/24/20   [provider]  cyclobenzaprine (FLEXERIL) 5 MG tablet Take 1 tablet (5 mg total) by mouth 2 (two) times daily as needed for muscle spasms. 01/25/23   Small, Brooke L, PA  levothyroxine (SYNTHROID) 25 MCG tablet Take 25 mcg by mouth daily before breakfast.    [provider]  lidocaine (XYLOCAINE) 2 % solution Use as directed 15 mLs in the mouth or throat as needed for mouth pain. 11/16/21   Khatri, Hina, PA-C  methocarbamol (ROBAXIN) 500 MG tablet Take 1 tablet (500 mg total) by mouth 2 (two) times daily. 11/16/21   Khatri, Hina, PA-C  mycophenolate (CELLCEPT) 500 MG tablet Take by mouth. 07/19/20   [provider]  omeprazole (PRILOSEC) 20 MG capsule Take 1 capsule (20 mg total) by mouth daily. 02/13/20   Molpus, John, MD   oxyCODONE-acetaminophen (PERCOCET) 5-325 MG tablet Take 1 tablet by mouth every 4 (four) hours as needed. 12/31/20   Gilda Crease, MD  prednisoLONE acetate (PRED FORTE) 1 % ophthalmic suspension Place 1 drop into the left eye 4 times daily. 06/24/20   [provider]      Allergies    Fish oil, Hydrocodone bit-homatrop mbr, and Prednisone    Review of Systems   Review of Systems  Musculoskeletal:        Left knee pain  All other systems reviewed and are negative.   Physical Exam Updated Vital Signs BP 121/80 (BP Location: Right Arm)   Pulse 64   Temp 98.2 F (36.8 C) (Oral)   Resp 17   Ht 5\' 1"  (1.549 m)   Wt 90.7 kg   LMP 10/01/2017   SpO2 99%   BMI 37.79 kg/m  Physical Exam Vitals and nursing note reviewed.  Constitutional:      Appearance: Normal appearance.  HENT:     Head: Normocephalic and atraumatic.     Mouth/Throat:     Mouth: Mucous membranes are moist.  Eyes:     Conjunctiva/sclera: Conjunctivae normal.     Pupils: Pupils are equal, round, and reactive to light.  Cardiovascular:     Rate and Rhythm: Normal rate and regular rhythm.     Pulses: Normal pulses.  Heart sounds: Normal heart sounds.  Pulmonary:     Effort: Pulmonary effort is normal.     Breath sounds: Normal breath sounds.  Abdominal:     Palpations: Abdomen is soft.     Tenderness: There is no abdominal tenderness.  Musculoskeletal:        General: Tenderness present. Normal range of motion.     Comments: Tenderness to palpation of medial aspect of left knee  Skin:    General: Skin is warm and dry.     Findings: No rash.  Neurological:     General: No focal deficit present.     Mental Status: She is alert.     Sensory: No sensory deficit.     Motor: No weakness.  Psychiatric:        Mood and Affect: Mood normal.        Behavior: Behavior normal.     ED Results / Procedures / Treatments   Labs (all labs ordered are listed, but only abnormal results are  displayed) Labs Reviewed - No data to display  EKG None  Radiology DG Knee Complete 4 Views Left  Result Date: 03/28/2023 CLINICAL DATA:  Left knee for 2 weeks No known injury EXAM: LEFT KNEE - COMPLETE 4+ VIEW COMPARISON:  09/07/2016 FINDINGS: No fracture or dislocation. Soft tissues are unremarkable. Mild degenerative changes of the medial compartment. IMPRESSION: No acute abnormality of the left knee.  Mild degenerative changes. Electronically Signed   By: Acquanetta Belling M.D.   On: 03/28/2023 14:22    Procedures Procedures: not indicated.   Medications Ordered in ED Medications  ibuprofen (ADVIL) tablet 800 mg (800 mg Oral Given 03/28/23 1451)    ED Course/ Medical Decision Making/ A&P                             Medical Decision Making Amount and/or Complexity of Data Reviewed Radiology: ordered.   This patient presents to the ED for concern of left knee pain, this involves an extensive number of treatment options, and is a complaint that carries with it a high risk of complications and morbidity.   Differential diagnosis includes: fracture vs dislocation vs osteoarthritis vs sprain vs ligamentous injury, etc.   Co morbidities that complicate the patient evaluation  Thyroid disease GERD Obesity   Additional history obtained:  Additional history obtained from patient's records.   Imaging Studies ordered:  I ordered imaging studies including left knee x-ray.  I independently visualized and interpreted imaging which showed:  I agree with the radiologist interpretation   Problem List / ED Course / Critical interventions / Medication management  Medial left knee pain I ordered medications including: Ibuprofen for pain prior to discharge I have reviewed the patients home medicines and have made adjustments as needed   Social Determinants of Health:  Access to healthcare   Test / Admission - Considered:  Discussed results with patient and husband at  bedside. Referral for Ortho provided. Patient is stable and safe for discharge home.  Return precautions provided.       Final Clinical Impression(s) / ED Diagnoses Final diagnoses:  Osteoarthritis of left knee, unspecified osteoarthritis type    Rx / DC Orders ED Discharge Orders          Ordered    ibuprofen (ADVIL) 800 MG tablet  3 times daily        03/28/23 1444  Maxwell Marion, PA-C 03/28/23 1525    Sloan Leiter, DO 03/29/23 [phone]

## 2023-03-28 NOTE — Discharge Instructions (Addendum)
As discussed, you have arthritis in your left knee. Your imaging does not show any dislocations or fractures but degenerative changes in your left knee.  For orthopedics has been provided to follow-up with for further management of your arthritic changes and pain.  Call their office either today or tomorrow to make an appointment to get established.  Take Ibuprofen every 8 hours as needed for knee pain. You can also ice your knee for pain relief.  Return to ED if: You have very bad pain in your joint. You have swelling in your joint. Your joint is red. Many joints become painful and swollen. You have very bad back pain. Your leg is very weak.

## 2023-08-15 ENCOUNTER — Encounter (HOSPITAL_BASED_OUTPATIENT_CLINIC_OR_DEPARTMENT_OTHER): Payer: Self-pay | Admitting: Emergency Medicine

## 2023-08-15 ENCOUNTER — Other Ambulatory Visit: Payer: Self-pay

## 2023-08-15 ENCOUNTER — Emergency Department (HOSPITAL_BASED_OUTPATIENT_CLINIC_OR_DEPARTMENT_OTHER): Payer: Medicaid Other

## 2023-08-15 ENCOUNTER — Emergency Department (HOSPITAL_BASED_OUTPATIENT_CLINIC_OR_DEPARTMENT_OTHER)
Admission: EM | Admit: 2023-08-15 | Discharge: 2023-08-15 | Disposition: A | Discharge status: Home / Self Care | Payer: Medicaid Other | Attending: Emergency Medicine | Admitting: Emergency Medicine

## 2023-08-15 DIAGNOSIS — B349 Viral infection, unspecified: Secondary | ICD-10-CM | POA: Insufficient documentation

## 2023-08-15 DIAGNOSIS — Z20822 Contact with and (suspected) exposure to covid-19: Secondary | ICD-10-CM | POA: Diagnosis not present

## 2023-08-15 DIAGNOSIS — M791 Myalgia, unspecified site: Secondary | ICD-10-CM | POA: Diagnosis present

## 2023-08-15 LAB — RESP PANEL BY RT-PCR (RSV, FLU A&B, COVID)  RVPGX2
Influenza A by PCR: NEGATIVE
Influenza B by PCR: NEGATIVE
Resp Syncytial Virus by PCR: NEGATIVE
SARS Coronavirus 2 by RT PCR: NEGATIVE

## 2023-08-15 LAB — I-STAT CHEM 8, ED
BUN: 18 mg/dL (ref 6–20)
Calcium, Ion: 1.23 mmol/L (ref 1.15–1.40)
Chloride: 105 mmol/L (ref 98–111)
Creatinine, Ser: 0.6 mg/dL (ref 0.44–1.00)
Glucose, Bld: 79 mg/dL (ref 70–99)
HCT: 34 % — ABNORMAL LOW (ref 36.0–46.0)
Hemoglobin: 11.6 g/dL — ABNORMAL LOW (ref 12.0–15.0)
Potassium: 4.1 mmol/L (ref 3.5–5.1)
Sodium: 141 mmol/L (ref 135–145)
TCO2: 26 mmol/L (ref 22–32)

## 2023-08-15 LAB — CBC WITH DIFFERENTIAL/PLATELET
Abs Immature Granulocytes: 0.04 10*3/uL (ref 0.00–0.07)
Basophils Absolute: 0 10*3/uL (ref 0.0–0.1)
Basophils Relative: 1 %
Eosinophils Absolute: 0.1 10*3/uL (ref 0.0–0.5)
Eosinophils Relative: 2 %
HCT: 34.6 % — ABNORMAL LOW (ref 36.0–46.0)
Hemoglobin: 11.2 g/dL — ABNORMAL LOW (ref 12.0–15.0)
Immature Granulocytes: 1 %
Lymphocytes Relative: 23 %
Lymphs Abs: 1.9 10*3/uL (ref 0.7–4.0)
MCH: 28.6 pg (ref 26.0–34.0)
MCHC: 32.4 g/dL (ref 30.0–36.0)
MCV: 88.3 fL (ref 80.0–100.0)
Monocytes Absolute: 0.6 10*3/uL (ref 0.1–1.0)
Monocytes Relative: 7 %
Neutro Abs: 5.5 10*3/uL (ref 1.7–7.7)
Neutrophils Relative %: 66 %
Platelets: 258 10*3/uL (ref 150–400)
RBC: 3.92 MIL/uL (ref 3.87–5.11)
RDW: 13.2 % (ref 11.5–15.5)
WBC: 8.1 10*3/uL (ref 4.0–10.5)
nRBC: 0 % (ref 0.0–0.2)

## 2023-08-15 MED ORDER — ACETAMINOPHEN 325 MG PO TABS
650.0000 mg | ORAL_TABLET | Freq: Once | ORAL | Status: AC
  Administered 2023-08-15: 650 mg via ORAL
  Filled 2023-08-15: qty 2

## 2023-08-15 NOTE — Discharge Instructions (Signed)
Please follow-up with your primary care provider in regards recent ER visit.  Today your labs and imaging are reassuring and most likely a viral illness.  Please take Tylenol every 6 hours needed for pain remain hydrated food as tolerated.  If symptoms change or worsen please return to ER.

## 2023-08-15 NOTE — ED Provider Notes (Signed)
[facility] Provider Note   CSN: [id number] Arrival date & time: 08/15/23  1427     History  Chief Complaint  Patient presents with   Generalized Body Aches    Jessica Fleming is a 57 y.o. female history of B12 deficiency, thyroid disease, GERD presented for generalized bodyaches for the past 3 days.  Patient has any sick contacts but states that she has also had decreased appetite as well.  Patient has any chest pain or shortness of breath or coughing up anything.  Patient denies any nausea vomiting, abdominal pain, dysuria, hematuria, neck pain, chest pain, shortness of breath, new onset weakness that she just feels achy all around.  Patient is tried Tylenol and ibuprofen which seems to help a little bit.   Home Medications Prior to Admission medications   Medication Sig Start Date End Date Taking? Authorizing Provider  atropine 1 % ophthalmic solution Place 1 drop into the left eye in the morning and at bedtime for 14 days. 06/24/20   [provider]  cyclobenzaprine (FLEXERIL) 5 MG tablet Take 1 tablet (5 mg total) by mouth 2 (two) times daily as needed for muscle spasms. 01/25/23   Small, Brooke L, PA  levothyroxine (SYNTHROID) 25 MCG tablet Take 25 mcg by mouth daily before breakfast.    [provider]  lidocaine (XYLOCAINE) 2 % solution Use as directed 15 mLs in the mouth or throat as needed for mouth pain. 11/16/21   Khatri, Hina, PA-C  methocarbamol (ROBAXIN) 500 MG tablet Take 1 tablet (500 mg total) by mouth 2 (two) times daily. 11/16/21   Khatri, Hina, PA-C  mycophenolate (CELLCEPT) 500 MG tablet Take by mouth. 07/19/20   [provider]  omeprazole (PRILOSEC) 20 MG capsule Take 1 capsule (20 mg total) by mouth daily. 02/13/20   Molpus, John, MD  oxyCODONE-acetaminophen (PERCOCET) 5-325 MG tablet Take 1 tablet by mouth every 4 (four) hours as needed. 12/31/20   Gilda Crease, MD  prednisoLONE acetate  (PRED FORTE) 1 % ophthalmic suspension Place 1 drop into the left eye 4 times daily. 06/24/20   [provider]      Allergies    Fish oil, Hydrocodone bit-homatrop mbr, and Prednisone    Review of Systems   Review of Systems  Physical Exam Updated Vital Signs BP (!) 143/78 (BP Location: Right Arm)   Pulse 62   Temp 98.1 F (36.7 C) (Oral)   Resp 18   Ht 5\' 1"  (1.549 m)   Wt 90.7 kg   LMP 10/01/2017   SpO2 96%   BMI 37.79 kg/m  Physical Exam Vitals reviewed.  Constitutional:      General: She is not in acute distress. HENT:     Head: Normocephalic and atraumatic.     Nose: Nose normal.     Mouth/Throat:     Mouth: Mucous membranes are moist.     Pharynx: No oropharyngeal exudate or posterior oropharyngeal erythema.  Eyes:     Extraocular Movements: Extraocular movements intact.     Conjunctiva/sclera: Conjunctivae normal.     Pupils: Pupils are equal, round, and reactive to light.  Cardiovascular:     Rate and Rhythm: Normal rate and regular rhythm.     Pulses: Normal pulses.     Heart sounds: Normal heart sounds.     Comments: 2+ bilateral radial/dorsalis pedis pulses with regular rate Pulmonary:     Effort: Pulmonary effort is normal. No respiratory distress.  Breath sounds: Normal breath sounds.  Abdominal:     Palpations: Abdomen is soft.     Tenderness: There is no abdominal tenderness. There is no guarding or rebound.  Musculoskeletal:        General: Normal range of motion.     Cervical back: Normal range of motion and neck supple.     Comments: 5 out of 5 bilateral grip/leg extension strength  Skin:    General: Skin is warm and dry.     Capillary Refill: Capillary refill takes less than 2 seconds.  Neurological:     General: No focal deficit present.     Mental Status: She is alert and oriented to person, place, and time.     Comments: Sensation intact in all 4 limbs  Psychiatric:        Mood and Affect: Mood normal.     ED Results /  Procedures / Treatments   Labs (all labs ordered are listed, but only abnormal results are displayed) Labs Reviewed  CBC WITH DIFFERENTIAL/PLATELET - Abnormal; Notable for the following components:      Result Value   Hemoglobin 11.2 (*)    HCT 34.6 (*)    All other components within normal limits  I-STAT CHEM 8, ED - Abnormal; Notable for the following components:   Hemoglobin 11.6 (*)    HCT 34.0 (*)    All other components within normal limits  RESP PANEL BY RT-PCR (RSV, FLU A&B, COVID)  RVPGX2    EKG None  Radiology DG Chest Port 1 View  Result Date: 08/15/2023 CLINICAL DATA:  Chest pain for 3 days.  Weakness. EXAM: PORTABLE CHEST 1 VIEW COMPARISON:  08/28/2022. FINDINGS: Cardiac silhouette is normal size configuration. No mediastinal or hilar masses. Clear lungs.  No pleural effusion or pneumothorax. Skeletal structures are grossly intact. IMPRESSION: No active disease. Electronically Signed   By: Amie Portland M.D.   On: 08/15/2023 15:21    Procedures Procedures    Medications Ordered in ED Medications  acetaminophen (TYLENOL) tablet 650 mg (650 mg Oral Given 08/15/23 1611)    ED Course/ Medical Decision Making/ A&P                                 Medical Decision Making  Jessica Fleming 57 y.o. presented today for URI like symptoms. Working DDx that I considered at this time includes, but not limited to, viral illness, pharyngitis, mono, sinusitis, electrolyte abnormality.  R/o DDx: pharyngitis, mono, sinusitis, electrolyte abnormality: these diagnoses are not consistent with patient's history, presentation, physical exam, labs/imaging findings.  Review of prior external notes: 07/11/2023 outpatient visit  Unique Tests and My Interpretation:  Respiratory Panel: Negative CBC: Unremarkable I-STAT Chem-8: Chest x-ray: Unremarkable  Social Determinants of Health: none  Discussion with Independent Historian:  Daughter  Discussion of Management of Tests:  None  Risk: Low: based on diagnostic testing/clinical impression and treatment plan  Risk Stratification Score: None  Plan: On exam patient was in no acute distress with stable vitals.  Patient's physical exam was largely unremarkable.  Patient was endorsing generalized bodyaches without any red flag symptoms and so do suspect viral illness at this time as her symptoms do improve with over-the-counter anti-inflammatories.  Will give patient Tylenol here and obtain basic labs at patient's request along with a chest x-ray to rule out pneumonia and swab for viral illnesses.  Patient adamantly denied any urinary symptoms and states  she does not have UTI and so will not get urine at this time.  Patient's labs and imaging were all reassuring and patient did pass a p.o. challenge here.  I spoke to the patient again and she declined a urine as she does not feel she has a UTI.  At this time patient I had a long discussion and agreed to have the patient be discharged and follow-up with her primary care provider.  I encouraged the patient to use Tylenol every 6 hours as needed for pain and to remain hydrated food as tolerated and to rest the next few days.  Work note given at patient's request.  At this time given patient's reassuring physical exam and labs along with vitals I have very low suspicion of any life-threatening diagnoses however return precautions were discussed.  Patient was given return precautions.patient stable for discharge at this time.  Patient verbalized understanding of plan.  This chart was dictated using voice recognition software.  Despite best efforts to proofread,  errors can occur which can change the documentation meaning.         Final Clinical Impression(s) / ED Diagnoses Final diagnoses:  Viral syndrome    Rx / DC Orders ED Discharge Orders     None         Remi Deter 08/15/23 1649    Loetta Rough, MD 08/15/23 1714

## 2023-08-15 NOTE — ED Triage Notes (Signed)
Pt having generalized body aches for 3 days.  No known fever, no cold symptoms, no sob, no chest pain.  Pt states she has poor activity tolerance, gets really tired after doing anything.  Pt concerned about her iron levels and low B 12.  Pt also concerned about her thyroid.

## 2023-08-15 NOTE — ED Notes (Signed)
PO challenge tolerated well.
